# Patient Record
Sex: Female | Born: 1935 | Race: White | Hispanic: No | Smoking: Never smoker
Health system: Southern US, Community
[De-identification: ages and names within clinical notes are randomized; demographics above are authoritative.]

## PROBLEM LIST (undated history)

## (undated) DIAGNOSIS — I10 Essential (primary) hypertension: Secondary | ICD-10-CM

## (undated) DIAGNOSIS — K219 Gastro-esophageal reflux disease without esophagitis: Secondary | ICD-10-CM

## (undated) HISTORY — PX: ABDOMINAL HYSTERECTOMY: SHX81

---

## 2003-12-25 ENCOUNTER — Ambulatory Visit (HOSPITAL_COMMUNITY): Admission: RE | Admit: 2003-12-25 | Discharge: 2003-12-25 | Payer: Self-pay | Admitting: Family Medicine

## 2003-12-30 ENCOUNTER — Ambulatory Visit (HOSPITAL_COMMUNITY): Admission: RE | Admit: 2003-12-30 | Discharge: 2003-12-30 | Payer: Self-pay | Admitting: Family Medicine

## 2003-12-31 ENCOUNTER — Ambulatory Visit (HOSPITAL_COMMUNITY): Admission: RE | Admit: 2003-12-31 | Discharge: 2003-12-31 | Payer: Self-pay | Admitting: Family Medicine

## 2004-01-02 ENCOUNTER — Ambulatory Visit (HOSPITAL_COMMUNITY): Admission: RE | Admit: 2004-01-02 | Discharge: 2004-01-02 | Payer: Self-pay | Admitting: Family Medicine

## 2004-01-11 ENCOUNTER — Ambulatory Visit (HOSPITAL_COMMUNITY): Admission: RE | Admit: 2004-01-11 | Discharge: 2004-01-11 | Payer: Self-pay | Admitting: Neurosurgery

## 2004-01-24 ENCOUNTER — Encounter (INDEPENDENT_AMBULATORY_CARE_PROVIDER_SITE_OTHER): Payer: Self-pay | Admitting: Specialist

## 2004-01-24 ENCOUNTER — Ambulatory Visit (HOSPITAL_COMMUNITY): Admission: RE | Admit: 2004-01-24 | Discharge: 2004-01-24 | Payer: Self-pay | Admitting: Neurosurgery

## 2007-07-07 ENCOUNTER — Ambulatory Visit (HOSPITAL_COMMUNITY): Admission: RE | Admit: 2007-07-07 | Discharge: 2007-07-07 | Payer: Self-pay | Admitting: Family Medicine

## 2010-06-28 ENCOUNTER — Encounter: Payer: Self-pay | Admitting: Family Medicine

## 2012-10-20 ENCOUNTER — Other Ambulatory Visit (HOSPITAL_COMMUNITY): Payer: Self-pay | Admitting: Internal Medicine

## 2012-10-20 DIAGNOSIS — Z0000 Encounter for general adult medical examination without abnormal findings: Secondary | ICD-10-CM

## 2012-10-25 ENCOUNTER — Ambulatory Visit (HOSPITAL_COMMUNITY)
Admission: RE | Admit: 2012-10-25 | Discharge: 2012-10-25 | Disposition: A | Discharge status: Home / Self Care | Payer: Medicare Other | Source: Ambulatory Visit | Attending: Internal Medicine | Admitting: Internal Medicine

## 2012-10-25 DIAGNOSIS — Z0000 Encounter for general adult medical examination without abnormal findings: Secondary | ICD-10-CM

## 2012-10-25 DIAGNOSIS — Z13820 Encounter for screening for osteoporosis: Secondary | ICD-10-CM | POA: Insufficient documentation

## 2012-10-25 DIAGNOSIS — Z780 Asymptomatic menopausal state: Secondary | ICD-10-CM | POA: Insufficient documentation

## 2014-06-19 DIAGNOSIS — I10 Essential (primary) hypertension: Secondary | ICD-10-CM | POA: Diagnosis not present

## 2014-06-19 DIAGNOSIS — Z6824 Body mass index (BMI) 24.0-24.9, adult: Secondary | ICD-10-CM | POA: Diagnosis not present

## 2014-11-25 DIAGNOSIS — Z6823 Body mass index (BMI) 23.0-23.9, adult: Secondary | ICD-10-CM | POA: Diagnosis not present

## 2014-11-25 DIAGNOSIS — I10 Essential (primary) hypertension: Secondary | ICD-10-CM | POA: Diagnosis not present

## 2014-12-23 DIAGNOSIS — Z6824 Body mass index (BMI) 24.0-24.9, adult: Secondary | ICD-10-CM | POA: Diagnosis not present

## 2014-12-23 DIAGNOSIS — Z1389 Encounter for screening for other disorder: Secondary | ICD-10-CM | POA: Diagnosis not present

## 2014-12-23 DIAGNOSIS — Z23 Encounter for immunization: Secondary | ICD-10-CM | POA: Diagnosis not present

## 2014-12-23 DIAGNOSIS — I10 Essential (primary) hypertension: Secondary | ICD-10-CM | POA: Diagnosis not present

## 2014-12-23 DIAGNOSIS — E782 Mixed hyperlipidemia: Secondary | ICD-10-CM | POA: Diagnosis not present

## 2014-12-25 DIAGNOSIS — Z1211 Encounter for screening for malignant neoplasm of colon: Secondary | ICD-10-CM | POA: Diagnosis not present

## 2014-12-30 DIAGNOSIS — L539 Erythematous condition, unspecified: Secondary | ICD-10-CM | POA: Diagnosis not present

## 2014-12-30 DIAGNOSIS — Z1389 Encounter for screening for other disorder: Secondary | ICD-10-CM | POA: Diagnosis not present

## 2014-12-30 DIAGNOSIS — Z6823 Body mass index (BMI) 23.0-23.9, adult: Secondary | ICD-10-CM | POA: Diagnosis not present

## 2014-12-30 DIAGNOSIS — T50Z95A Adverse effect of other vaccines and biological substances, initial encounter: Secondary | ICD-10-CM | POA: Diagnosis not present

## 2015-03-06 DIAGNOSIS — Z1231 Encounter for screening mammogram for malignant neoplasm of breast: Secondary | ICD-10-CM | POA: Diagnosis not present

## 2015-03-28 DIAGNOSIS — Z23 Encounter for immunization: Secondary | ICD-10-CM | POA: Diagnosis not present

## 2015-03-28 DIAGNOSIS — Z6823 Body mass index (BMI) 23.0-23.9, adult: Secondary | ICD-10-CM | POA: Diagnosis not present

## 2015-03-28 DIAGNOSIS — I10 Essential (primary) hypertension: Secondary | ICD-10-CM | POA: Diagnosis not present

## 2015-03-28 DIAGNOSIS — R7309 Other abnormal glucose: Secondary | ICD-10-CM | POA: Diagnosis not present

## 2015-03-28 DIAGNOSIS — E782 Mixed hyperlipidemia: Secondary | ICD-10-CM | POA: Diagnosis not present

## 2015-03-28 DIAGNOSIS — Z1389 Encounter for screening for other disorder: Secondary | ICD-10-CM | POA: Diagnosis not present

## 2015-08-07 DIAGNOSIS — Z6824 Body mass index (BMI) 24.0-24.9, adult: Secondary | ICD-10-CM | POA: Diagnosis not present

## 2015-08-07 DIAGNOSIS — I4510 Unspecified right bundle-branch block: Secondary | ICD-10-CM | POA: Diagnosis not present

## 2015-08-07 DIAGNOSIS — Z1389 Encounter for screening for other disorder: Secondary | ICD-10-CM | POA: Diagnosis not present

## 2015-08-07 DIAGNOSIS — I10 Essential (primary) hypertension: Secondary | ICD-10-CM | POA: Diagnosis not present

## 2015-08-13 DIAGNOSIS — Z6823 Body mass index (BMI) 23.0-23.9, adult: Secondary | ICD-10-CM | POA: Diagnosis not present

## 2015-08-13 DIAGNOSIS — Z0000 Encounter for general adult medical examination without abnormal findings: Secondary | ICD-10-CM | POA: Diagnosis not present

## 2015-08-15 DIAGNOSIS — H5210 Myopia, unspecified eye: Secondary | ICD-10-CM | POA: Diagnosis not present

## 2015-08-15 DIAGNOSIS — H25813 Combined forms of age-related cataract, bilateral: Secondary | ICD-10-CM | POA: Diagnosis not present

## 2015-08-15 DIAGNOSIS — H5200 Hypermetropia, unspecified eye: Secondary | ICD-10-CM | POA: Diagnosis not present

## 2015-11-28 DIAGNOSIS — I10 Essential (primary) hypertension: Secondary | ICD-10-CM | POA: Diagnosis not present

## 2015-11-28 DIAGNOSIS — Z6823 Body mass index (BMI) 23.0-23.9, adult: Secondary | ICD-10-CM | POA: Diagnosis not present

## 2015-12-03 DIAGNOSIS — Z1389 Encounter for screening for other disorder: Secondary | ICD-10-CM | POA: Diagnosis not present

## 2015-12-03 DIAGNOSIS — I10 Essential (primary) hypertension: Secondary | ICD-10-CM | POA: Diagnosis not present

## 2015-12-03 DIAGNOSIS — Z6823 Body mass index (BMI) 23.0-23.9, adult: Secondary | ICD-10-CM | POA: Diagnosis not present

## 2016-03-26 DIAGNOSIS — Z1231 Encounter for screening mammogram for malignant neoplasm of breast: Secondary | ICD-10-CM | POA: Diagnosis not present

## 2016-09-02 DIAGNOSIS — Z6823 Body mass index (BMI) 23.0-23.9, adult: Secondary | ICD-10-CM | POA: Diagnosis not present

## 2016-09-02 DIAGNOSIS — I10 Essential (primary) hypertension: Secondary | ICD-10-CM | POA: Diagnosis not present

## 2016-09-02 DIAGNOSIS — Z1389 Encounter for screening for other disorder: Secondary | ICD-10-CM | POA: Diagnosis not present

## 2016-09-02 DIAGNOSIS — E782 Mixed hyperlipidemia: Secondary | ICD-10-CM | POA: Diagnosis not present

## 2016-12-01 DIAGNOSIS — Z1389 Encounter for screening for other disorder: Secondary | ICD-10-CM | POA: Diagnosis not present

## 2016-12-01 DIAGNOSIS — E782 Mixed hyperlipidemia: Secondary | ICD-10-CM | POA: Diagnosis not present

## 2016-12-01 DIAGNOSIS — I10 Essential (primary) hypertension: Secondary | ICD-10-CM | POA: Diagnosis not present

## 2016-12-01 DIAGNOSIS — Z6822 Body mass index (BMI) 22.0-22.9, adult: Secondary | ICD-10-CM | POA: Diagnosis not present

## 2016-12-28 DIAGNOSIS — R062 Wheezing: Secondary | ICD-10-CM | POA: Diagnosis not present

## 2016-12-28 DIAGNOSIS — Z6823 Body mass index (BMI) 23.0-23.9, adult: Secondary | ICD-10-CM | POA: Diagnosis not present

## 2016-12-28 DIAGNOSIS — I10 Essential (primary) hypertension: Secondary | ICD-10-CM | POA: Diagnosis not present

## 2016-12-28 DIAGNOSIS — E782 Mixed hyperlipidemia: Secondary | ICD-10-CM | POA: Diagnosis not present

## 2016-12-28 DIAGNOSIS — J069 Acute upper respiratory infection, unspecified: Secondary | ICD-10-CM | POA: Diagnosis not present

## 2016-12-28 DIAGNOSIS — Z1389 Encounter for screening for other disorder: Secondary | ICD-10-CM | POA: Diagnosis not present

## 2016-12-28 DIAGNOSIS — J343 Hypertrophy of nasal turbinates: Secondary | ICD-10-CM | POA: Diagnosis not present

## 2017-03-28 DIAGNOSIS — Z1231 Encounter for screening mammogram for malignant neoplasm of breast: Secondary | ICD-10-CM | POA: Diagnosis not present

## 2017-04-07 DIAGNOSIS — N6001 Solitary cyst of right breast: Secondary | ICD-10-CM | POA: Diagnosis not present

## 2017-04-15 DIAGNOSIS — Z1389 Encounter for screening for other disorder: Secondary | ICD-10-CM | POA: Diagnosis not present

## 2017-04-15 DIAGNOSIS — Z6823 Body mass index (BMI) 23.0-23.9, adult: Secondary | ICD-10-CM | POA: Diagnosis not present

## 2017-04-15 DIAGNOSIS — Z0000 Encounter for general adult medical examination without abnormal findings: Secondary | ICD-10-CM | POA: Diagnosis not present

## 2017-04-18 DIAGNOSIS — Z1389 Encounter for screening for other disorder: Secondary | ICD-10-CM | POA: Diagnosis not present

## 2017-04-18 DIAGNOSIS — Z0000 Encounter for general adult medical examination without abnormal findings: Secondary | ICD-10-CM | POA: Diagnosis not present

## 2017-04-18 DIAGNOSIS — R739 Hyperglycemia, unspecified: Secondary | ICD-10-CM | POA: Diagnosis not present

## 2017-04-18 DIAGNOSIS — Z6823 Body mass index (BMI) 23.0-23.9, adult: Secondary | ICD-10-CM | POA: Diagnosis not present

## 2017-04-18 DIAGNOSIS — R7309 Other abnormal glucose: Secondary | ICD-10-CM | POA: Diagnosis not present

## 2017-05-11 DIAGNOSIS — Z6822 Body mass index (BMI) 22.0-22.9, adult: Secondary | ICD-10-CM | POA: Diagnosis not present

## 2017-05-11 DIAGNOSIS — I10 Essential (primary) hypertension: Secondary | ICD-10-CM | POA: Diagnosis not present

## 2017-05-11 DIAGNOSIS — J329 Chronic sinusitis, unspecified: Secondary | ICD-10-CM | POA: Diagnosis not present

## 2017-05-11 DIAGNOSIS — R498 Other voice and resonance disorders: Secondary | ICD-10-CM | POA: Diagnosis not present

## 2017-07-28 DIAGNOSIS — Z1389 Encounter for screening for other disorder: Secondary | ICD-10-CM | POA: Diagnosis not present

## 2017-07-28 DIAGNOSIS — J3489 Other specified disorders of nose and nasal sinuses: Secondary | ICD-10-CM | POA: Diagnosis not present

## 2017-07-28 DIAGNOSIS — J019 Acute sinusitis, unspecified: Secondary | ICD-10-CM | POA: Diagnosis not present

## 2017-07-28 DIAGNOSIS — Z6822 Body mass index (BMI) 22.0-22.9, adult: Secondary | ICD-10-CM | POA: Diagnosis not present

## 2017-11-16 DIAGNOSIS — Z6822 Body mass index (BMI) 22.0-22.9, adult: Secondary | ICD-10-CM | POA: Diagnosis not present

## 2017-11-16 DIAGNOSIS — I10 Essential (primary) hypertension: Secondary | ICD-10-CM | POA: Diagnosis not present

## 2017-11-16 DIAGNOSIS — Z1389 Encounter for screening for other disorder: Secondary | ICD-10-CM | POA: Diagnosis not present

## 2018-04-10 DIAGNOSIS — Z1231 Encounter for screening mammogram for malignant neoplasm of breast: Secondary | ICD-10-CM | POA: Diagnosis not present

## 2018-05-22 DIAGNOSIS — Z0000 Encounter for general adult medical examination without abnormal findings: Secondary | ICD-10-CM | POA: Diagnosis not present

## 2018-05-22 DIAGNOSIS — R7309 Other abnormal glucose: Secondary | ICD-10-CM | POA: Diagnosis not present

## 2018-05-22 DIAGNOSIS — Z6823 Body mass index (BMI) 23.0-23.9, adult: Secondary | ICD-10-CM | POA: Diagnosis not present

## 2018-05-22 DIAGNOSIS — Z1389 Encounter for screening for other disorder: Secondary | ICD-10-CM | POA: Diagnosis not present

## 2018-05-22 DIAGNOSIS — Z0001 Encounter for general adult medical examination with abnormal findings: Secondary | ICD-10-CM | POA: Diagnosis not present

## 2018-06-06 DIAGNOSIS — Z6822 Body mass index (BMI) 22.0-22.9, adult: Secondary | ICD-10-CM | POA: Diagnosis not present

## 2018-06-06 DIAGNOSIS — Z1389 Encounter for screening for other disorder: Secondary | ICD-10-CM | POA: Diagnosis not present

## 2018-06-06 DIAGNOSIS — R05 Cough: Secondary | ICD-10-CM | POA: Diagnosis not present

## 2018-06-06 DIAGNOSIS — J343 Hypertrophy of nasal turbinates: Secondary | ICD-10-CM | POA: Diagnosis not present

## 2018-06-06 DIAGNOSIS — J029 Acute pharyngitis, unspecified: Secondary | ICD-10-CM | POA: Diagnosis not present

## 2018-10-23 ENCOUNTER — Other Ambulatory Visit (HOSPITAL_COMMUNITY): Payer: Self-pay | Admitting: Family Medicine

## 2018-10-23 DIAGNOSIS — E2839 Other primary ovarian failure: Secondary | ICD-10-CM

## 2019-01-10 DIAGNOSIS — H5200 Hypermetropia, unspecified eye: Secondary | ICD-10-CM | POA: Diagnosis not present

## 2019-01-18 DIAGNOSIS — Z0100 Encounter for examination of eyes and vision without abnormal findings: Secondary | ICD-10-CM | POA: Diagnosis not present

## 2019-03-13 DIAGNOSIS — Z23 Encounter for immunization: Secondary | ICD-10-CM | POA: Diagnosis not present

## 2019-03-13 DIAGNOSIS — Z6823 Body mass index (BMI) 23.0-23.9, adult: Secondary | ICD-10-CM | POA: Diagnosis not present

## 2019-03-13 DIAGNOSIS — Z1389 Encounter for screening for other disorder: Secondary | ICD-10-CM | POA: Diagnosis not present

## 2019-03-13 DIAGNOSIS — E7849 Other hyperlipidemia: Secondary | ICD-10-CM | POA: Diagnosis not present

## 2019-03-13 DIAGNOSIS — Z0000 Encounter for general adult medical examination without abnormal findings: Secondary | ICD-10-CM | POA: Diagnosis not present

## 2019-03-13 DIAGNOSIS — I10 Essential (primary) hypertension: Secondary | ICD-10-CM | POA: Diagnosis not present

## 2019-05-07 DIAGNOSIS — Z1231 Encounter for screening mammogram for malignant neoplasm of breast: Secondary | ICD-10-CM | POA: Diagnosis not present

## 2019-09-18 ENCOUNTER — Encounter (HOSPITAL_COMMUNITY): Payer: Self-pay

## 2019-09-18 ENCOUNTER — Other Ambulatory Visit: Payer: Self-pay

## 2019-09-18 DIAGNOSIS — R739 Hyperglycemia, unspecified: Secondary | ICD-10-CM | POA: Insufficient documentation

## 2019-09-18 DIAGNOSIS — I10 Essential (primary) hypertension: Secondary | ICD-10-CM | POA: Diagnosis not present

## 2019-09-18 DIAGNOSIS — R1013 Epigastric pain: Secondary | ICD-10-CM | POA: Insufficient documentation

## 2019-09-18 DIAGNOSIS — E1165 Type 2 diabetes mellitus with hyperglycemia: Secondary | ICD-10-CM | POA: Diagnosis not present

## 2019-09-18 DIAGNOSIS — R109 Unspecified abdominal pain: Secondary | ICD-10-CM | POA: Diagnosis present

## 2019-09-18 MED ORDER — SODIUM CHLORIDE 0.9% FLUSH: 3.0000 mL | Freq: Once | INTRAVENOUS | Status: DC

## 2019-09-18 NOTE — ED Triage Notes (Signed)
Pt presents to ED with complaints of epigastric abdominal pain and nausea started couple hours ago.

## 2019-09-19 ENCOUNTER — Emergency Department (HOSPITAL_COMMUNITY)
Admission: EM | Admit: 2019-09-19 | Discharge: 2019-09-19 | Disposition: A | Discharge status: Home / Self Care | Payer: Medicare HMO | Attending: Emergency Medicine | Admitting: Emergency Medicine

## 2019-09-19 DIAGNOSIS — E1165 Type 2 diabetes mellitus with hyperglycemia: Secondary | ICD-10-CM | POA: Diagnosis not present

## 2019-09-19 DIAGNOSIS — R1013 Epigastric pain: Secondary | ICD-10-CM

## 2019-09-19 DIAGNOSIS — R739 Hyperglycemia, unspecified: Secondary | ICD-10-CM

## 2019-09-19 DIAGNOSIS — R7309 Other abnormal glucose: Secondary | ICD-10-CM

## 2019-09-19 HISTORY — DX: Essential (primary) hypertension: I10

## 2019-09-19 LAB — CBC
HCT: 41 % (ref 36.0–46.0)
Hemoglobin: 13.9 g/dL (ref 12.0–15.0)
MCH: 31.7 pg (ref 26.0–34.0)
MCHC: 33.9 g/dL (ref 30.0–36.0)
MCV: 93.4 fL (ref 80.0–100.0)
Platelets: 284 10*3/uL (ref 150–400)
RBC: 4.39 MIL/uL (ref 3.87–5.11)
RDW: 12.6 % (ref 11.5–15.5)
WBC: 11.5 10*3/uL — ABNORMAL HIGH (ref 4.0–10.5)
nRBC: 0 % (ref 0.0–0.2)

## 2019-09-19 LAB — COMPREHENSIVE METABOLIC PANEL
ALT: 33 U/L (ref 0–44)
AST: 26 U/L (ref 15–41)
Albumin: 4.7 g/dL (ref 3.5–5.0)
Alkaline Phosphatase: 88 U/L (ref 38–126)
Anion gap: 9 (ref 5–15)
BUN: 19 mg/dL (ref 8–23)
CO2: 25 mmol/L (ref 22–32)
Calcium: 9.6 mg/dL (ref 8.9–10.3)
Chloride: 103 mmol/L (ref 98–111)
Creatinine, Ser: 0.52 mg/dL (ref 0.44–1.00)
GFR calc Af Amer: >60 mL/min (ref >60)
GFR calc non Af Amer: >60 mL/min (ref >60)
Glucose, Bld: 158 mg/dL — ABNORMAL HIGH (ref 70–99)
Potassium: 3.6 mmol/L (ref 3.5–5.1)
Sodium: 137 mmol/L (ref 135–145)
Total Bilirubin: 0.4 mg/dL (ref 0.3–1.2)
Total Protein: 7.9 g/dL (ref 6.5–8.1)

## 2019-09-19 LAB — LIPASE, BLOOD: Lipase: 25 U/L (ref 11–51)

## 2019-09-19 LAB — TROPONIN I (HIGH SENSITIVITY)
Troponin I (High Sensitivity): 3 ng/L (ref <18)
Troponin I (High Sensitivity): 3 ng/L (ref <18)

## 2019-09-19 MED ORDER — ALUM & MAG HYDROXIDE-SIMETH 200-200-20 MG/5ML PO SUSP
30.0000 mL | Freq: Once | ORAL | Status: AC
  Administered 2019-09-19: 30 mL via ORAL
  Filled 2019-09-19: qty 30

## 2019-09-19 MED ORDER — LIDOCAINE VISCOUS HCL 2 % MT SOLN
15.0000 mL | Freq: Once | OROMUCOSAL | Status: AC
  Administered 2019-09-19: 15 mL via ORAL
  Filled 2019-09-19: qty 15

## 2019-09-19 MED ORDER — PANTOPRAZOLE SODIUM 40 MG PO TBEC: 40.0000 mg | DELAYED_RELEASE_TABLET | Freq: Every day | ORAL | 0 refills | Status: DC

## 2019-09-19 MED ORDER — ONDANSETRON 8 MG PO TBDP
8.0000 mg | ORAL_TABLET | Freq: Once | ORAL | Status: AC
  Administered 2019-09-19: 8 mg via ORAL
  Filled 2019-09-19: qty 1

## 2019-09-19 MED ORDER — PANTOPRAZOLE SODIUM 40 MG PO TBEC
40.0000 mg | DELAYED_RELEASE_TABLET | Freq: Once | ORAL | Status: AC
  Administered 2019-09-19: 40 mg via ORAL
  Filled 2019-09-19: qty 1

## 2019-09-19 NOTE — ED Provider Notes (Signed)
[facility] EMERGENCY DEPARTMENT Provider Note   CSN: [id number] Arrival date & time: 09/18/19  2232   History Chief Complaint  Patient presents with  . Abdominal Pain    Felicia Whitehead is a 84 y.o. female.  The history is provided by the patient.  Abdominal Pain She has history of hypertension and comes in because of epigastric pain without radiation.  Pain started about 8 PM.  This started shortly after eating a hot dog.  There is associated nausea but no vomiting.  Pain seems to be subsiding, but she still rates it at 8/10.  She denies dyspnea or diaphoresis.  She did not have a bowel movement today, but that is not necessarily unusual for her.  She denies history of diabetes, hyperlipidemia.  She denies tobacco use and denies family history of premature coronary atherosclerosis.  Past Medical History:  Diagnosis Date  . Hypertension     There are no problems to display for this patient.   History reviewed. No pertinent surgical history.   OB History   No obstetric history on file.     No family history on file.  Social History   Tobacco Use  . Smoking status: Never Smoker  . Smokeless tobacco: Never Used  Substance Use Topics  . Alcohol use: Never  . Drug use: Never    Home Medications Prior to Admission medications   Not on File    Allergies    Patient has no allergy information on record.  Review of Systems   Review of Systems  Gastrointestinal: Positive for abdominal pain.  All other systems reviewed and are negative.   Physical Exam Updated Vital Signs BP (!) 162/81 (BP Location: Right Arm)   Pulse (!) 102   Temp 98.2 F (36.8 C) (Oral)   Resp 18   Ht 5\' 3"  (1.6 m)   Wt 62.1 kg   SpO2 99%   BMI 24.27 kg/m   Physical Exam Vitals and nursing note reviewed.   84 year old female, resting comfortably and in no acute distress. Vital signs are significant for borderline elevated heart rate and elevated blood pressure. Oxygen saturation  is 99%, which is normal. Head is normocephalic and atraumatic. PERRLA, EOMI. Oropharynx is clear. Neck is nontender and supple without adenopathy or JVD. Back is nontender and there is no CVA tenderness. Lungs are clear without rales, wheezes, or rhonchi. Chest is nontender. Heart has regular rate and rhythm without murmur. Abdomen is soft, flat, with minimal epigastric tenderness.  There is no rebound or guarding.  There are no masses or hepatosplenomegaly and peristalsis is hypoactive. Extremities have no cyanosis or edema, full range of motion is present. Skin is warm and dry without rash. Neurologic: Mental status is normal, cranial nerves are intact, there are no motor or sensory deficits.  ED Results / Procedures / Treatments   Labs (all labs ordered are listed, but only abnormal results are displayed) Labs Reviewed  COMPREHENSIVE METABOLIC PANEL - Abnormal; Notable for the following components:      Result Value   Glucose, Bld 158 (*)    All other components within normal limits  CBC - Abnormal; Notable for the following components:   WBC 11.5 (*)    All other components within normal limits  LIPASE, BLOOD  URINALYSIS, ROUTINE W REFLEX MICROSCOPIC  TROPONIN I (HIGH SENSITIVITY)  TROPONIN I (HIGH SENSITIVITY)    EKG EKG Interpretation  Date/Time:  Tuesday September 18 2019 22:44:21 EDT Ventricular Rate:  69  PR Interval:  152 QRS Duration: 140 QT Interval:  394 QTC Calculation: 479 R Axis:   12 Text Interpretation: Normal sinus rhythm Possible Left atrial enlargement Right bundle branch block Abnormal ECG No old tracing to compare Confirmed by Dione Booze ([id number]) on 09/19/2019 12:28:23 AM   Procedures Procedures  Medications Ordered in ED Medications  sodium chloride flush (NS) 0.9 % injection 3 mL (has no administration in time range)    ED Course  I have reviewed the triage vital signs and the nursing notes.  Pertinent labs & imaging results that were available  during my care of the patient were reviewed by me and considered in my medical decision making (see chart for details).  Epigastric pain of uncertain cause.  Certainly, need to consider angina equivalent.  This is a condition with high potential morbidity and mortality.  Possible gastritis.  Consider pancreatitis, although presentation is very atypical for that.  Consider biliary colic, although no right upper quadrant tenderness present.  Old records are reviewed, and she has no relevant past visits, no prior abdominal imaging.  I do not see indications for abdominal imaging currently.  ECG shows right bundle branch block with no ST or T changes, no prior ECG available for comparison.  Will check screening labs including troponin and give a GI cocktail.  Initial work-up is significant only for glucose elevated to 158.  This will need to be followed as an outpatient.  Second troponin is pending.  Her heart pathway score is 4 which does put her at elevated risk for major adverse cardiac events.  If second troponin is normal, she can safely be discharged to continue work-up as an outpatient.  Repeat troponin is normal.  She is given a prescription for pantoprazole and is to follow-up with her primary care provider.  MDM Rules/Calculators/A&P  Final Clinical Impression(s) / ED Diagnoses Final diagnoses:  Epigastric pain  Elevated random blood glucose level    Rx / DC Orders ED Discharge Orders         Ordered    pantoprazole (PROTONIX) 40 MG tablet  Daily     09/19/19 0237           Dione Booze, MD 09/19/19 [phone]

## 2019-09-19 NOTE — Discharge Instructions (Addendum)
Use antacids as needed.  Try to avoid foods that seem to bother your stomach.  Return if you are having any problems.  Your blood sugar was a little high today. Please follow up with your primary care provider to monitor that.

## 2019-09-21 DIAGNOSIS — Z6823 Body mass index (BMI) 23.0-23.9, adult: Secondary | ICD-10-CM | POA: Diagnosis not present

## 2019-09-21 DIAGNOSIS — T50905A Adverse effect of unspecified drugs, medicaments and biological substances, initial encounter: Secondary | ICD-10-CM | POA: Diagnosis not present

## 2019-09-21 DIAGNOSIS — F4320 Adjustment disorder, unspecified: Secondary | ICD-10-CM | POA: Diagnosis not present

## 2019-09-21 DIAGNOSIS — K219 Gastro-esophageal reflux disease without esophagitis: Secondary | ICD-10-CM | POA: Diagnosis not present

## 2019-09-21 DIAGNOSIS — Z1389 Encounter for screening for other disorder: Secondary | ICD-10-CM | POA: Diagnosis not present

## 2019-09-24 DIAGNOSIS — Z1389 Encounter for screening for other disorder: Secondary | ICD-10-CM | POA: Diagnosis not present

## 2019-09-24 DIAGNOSIS — E782 Mixed hyperlipidemia: Secondary | ICD-10-CM | POA: Diagnosis not present

## 2019-09-24 DIAGNOSIS — I10 Essential (primary) hypertension: Secondary | ICD-10-CM | POA: Diagnosis not present

## 2019-09-24 DIAGNOSIS — Z6823 Body mass index (BMI) 23.0-23.9, adult: Secondary | ICD-10-CM | POA: Diagnosis not present

## 2019-09-24 DIAGNOSIS — R7309 Other abnormal glucose: Secondary | ICD-10-CM | POA: Diagnosis not present

## 2019-10-03 ENCOUNTER — Emergency Department (HOSPITAL_COMMUNITY): Payer: Medicare HMO

## 2019-10-03 ENCOUNTER — Observation Stay (HOSPITAL_COMMUNITY)
Admission: EM | Admit: 2019-10-03 | Discharge: 2019-10-04 | Disposition: A | Discharge status: Home / Self Care | Payer: Medicare HMO | Attending: Internal Medicine | Admitting: Internal Medicine

## 2019-10-03 ENCOUNTER — Encounter (HOSPITAL_COMMUNITY): Payer: Self-pay

## 2019-10-03 ENCOUNTER — Other Ambulatory Visit: Payer: Self-pay

## 2019-10-03 DIAGNOSIS — K760 Fatty (change of) liver, not elsewhere classified: Secondary | ICD-10-CM | POA: Diagnosis not present

## 2019-10-03 DIAGNOSIS — A048 Other specified bacterial intestinal infections: Secondary | ICD-10-CM

## 2019-10-03 DIAGNOSIS — K219 Gastro-esophageal reflux disease without esophagitis: Secondary | ICD-10-CM | POA: Diagnosis not present

## 2019-10-03 DIAGNOSIS — Z79899 Other long term (current) drug therapy: Secondary | ICD-10-CM | POA: Insufficient documentation

## 2019-10-03 DIAGNOSIS — R1013 Epigastric pain: Secondary | ICD-10-CM

## 2019-10-03 DIAGNOSIS — K8010 Calculus of gallbladder with chronic cholecystitis without obstruction: Secondary | ICD-10-CM

## 2019-10-03 DIAGNOSIS — K8020 Calculus of gallbladder without cholecystitis without obstruction: Secondary | ICD-10-CM | POA: Diagnosis not present

## 2019-10-03 DIAGNOSIS — B9681 Helicobacter pylori [H. pylori] as the cause of diseases classified elsewhere: Secondary | ICD-10-CM | POA: Diagnosis not present

## 2019-10-03 DIAGNOSIS — Z20822 Contact with and (suspected) exposure to covid-19: Secondary | ICD-10-CM | POA: Diagnosis not present

## 2019-10-03 DIAGNOSIS — R55 Syncope and collapse: Secondary | ICD-10-CM | POA: Diagnosis not present

## 2019-10-03 DIAGNOSIS — Z888 Allergy status to other drugs, medicaments and biological substances status: Secondary | ICD-10-CM | POA: Diagnosis not present

## 2019-10-03 DIAGNOSIS — K810 Acute cholecystitis: Principal | ICD-10-CM | POA: Diagnosis present

## 2019-10-03 DIAGNOSIS — I10 Essential (primary) hypertension: Secondary | ICD-10-CM | POA: Insufficient documentation

## 2019-10-03 DIAGNOSIS — R531 Weakness: Secondary | ICD-10-CM | POA: Insufficient documentation

## 2019-10-03 LAB — HEPATIC FUNCTION PANEL
ALT: 25 U/L (ref 0–44)
AST: 20 U/L (ref 15–41)
Albumin: 4.2 g/dL (ref 3.5–5.0)
Alkaline Phosphatase: 76 U/L (ref 38–126)
Bilirubin, Direct: <0.1 mg/dL (ref 0.0–0.2)
Total Bilirubin: 0.3 mg/dL (ref 0.3–1.2)
Total Protein: 7.7 g/dL (ref 6.5–8.1)

## 2019-10-03 LAB — CBC WITH DIFFERENTIAL/PLATELET
Abs Immature Granulocytes: 0.04 10*3/uL (ref 0.00–0.07)
Basophils Absolute: 0.1 10*3/uL (ref 0.0–0.1)
Basophils Relative: 1 %
Eosinophils Absolute: 0.1 10*3/uL (ref 0.0–0.5)
Eosinophils Relative: 1 %
HCT: 42.3 % (ref 36.0–46.0)
Hemoglobin: 14.6 g/dL (ref 12.0–15.0)
Immature Granulocytes: 1 %
Lymphocytes Relative: 13 %
Lymphs Abs: 1 10*3/uL (ref 0.7–4.0)
MCH: 31.3 pg (ref 26.0–34.0)
MCHC: 34.5 g/dL (ref 30.0–36.0)
MCV: 90.6 fL (ref 80.0–100.0)
Monocytes Absolute: 0.6 10*3/uL (ref 0.1–1.0)
Monocytes Relative: 8 %
Neutro Abs: 5.9 10*3/uL (ref 1.7–7.7)
Neutrophils Relative %: 76 %
Platelets: 319 10*3/uL (ref 150–400)
RBC: 4.67 MIL/uL (ref 3.87–5.11)
RDW: 12 % (ref 11.5–15.5)
WBC: 7.7 10*3/uL (ref 4.0–10.5)
nRBC: 0 % (ref 0.0–0.2)

## 2019-10-03 LAB — RESPIRATORY PANEL BY RT PCR (FLU A&B, COVID)
Influenza A by PCR: NEGATIVE
Influenza B by PCR: NEGATIVE
SARS Coronavirus 2 by RT PCR: NEGATIVE

## 2019-10-03 LAB — BASIC METABOLIC PANEL
Anion gap: 12 (ref 5–15)
BUN: 17 mg/dL (ref 8–23)
CO2: 26 mmol/L (ref 22–32)
Calcium: 9.8 mg/dL (ref 8.9–10.3)
Chloride: 101 mmol/L (ref 98–111)
Creatinine, Ser: 0.62 mg/dL (ref 0.44–1.00)
GFR calc Af Amer: >60 mL/min (ref >60)
GFR calc non Af Amer: >60 mL/min (ref >60)
Glucose, Bld: 120 mg/dL — ABNORMAL HIGH (ref 70–99)
Potassium: 3 mmol/L — ABNORMAL LOW (ref 3.5–5.1)
Sodium: 139 mmol/L (ref 135–145)

## 2019-10-03 LAB — URINALYSIS, ROUTINE W REFLEX MICROSCOPIC
Bacteria, UA: NONE SEEN
Bilirubin Urine: NEGATIVE
Glucose, UA: NEGATIVE mg/dL
Ketones, ur: 5 mg/dL — AB
Leukocytes,Ua: NEGATIVE
Nitrite: NEGATIVE
Protein, ur: NEGATIVE mg/dL
Specific Gravity, Urine: 1.005 (ref 1.005–1.030)
pH: 8 (ref 5.0–8.0)

## 2019-10-03 LAB — TROPONIN I (HIGH SENSITIVITY)
Troponin I (High Sensitivity): 3 ng/L (ref <18)
Troponin I (High Sensitivity): 5 ng/L (ref <18)

## 2019-10-03 LAB — LIPASE, BLOOD: Lipase: 29 U/L (ref 11–51)

## 2019-10-03 LAB — CBG MONITORING, ED: Glucose-Capillary: 114 mg/dL — ABNORMAL HIGH (ref 70–99)

## 2019-10-03 MED ORDER — ACETAMINOPHEN 325 MG PO TABS: 650.0000 mg | ORAL_TABLET | Freq: Four times a day (QID) | ORAL | Status: DC | PRN

## 2019-10-03 MED ORDER — SODIUM CHLORIDE 0.9 % IV BOLUS
1000.0000 mL | Freq: Once | INTRAVENOUS | Status: AC
  Administered 2019-10-03: 1000 mL via INTRAVENOUS

## 2019-10-03 MED ORDER — ACETAMINOPHEN 650 MG RE SUPP: 650.0000 mg | Freq: Four times a day (QID) | RECTAL | Status: DC | PRN

## 2019-10-03 MED ORDER — ONDANSETRON HCL 4 MG PO TABS: 4.0000 mg | ORAL_TABLET | Freq: Four times a day (QID) | ORAL | Status: DC | PRN

## 2019-10-03 MED ORDER — PANTOPRAZOLE SODIUM 40 MG PO TBEC
40.0000 mg | DELAYED_RELEASE_TABLET | Freq: Every day | ORAL | Status: DC
  Administered 2019-10-03 – 2019-10-04 (×2): 40 mg via ORAL
  Filled 2019-10-03 (×2): qty 1

## 2019-10-03 MED ORDER — FLUOXETINE HCL 20 MG PO CAPS
20.0000 mg | ORAL_CAPSULE | Freq: Every day | ORAL | Status: DC
  Administered 2019-10-03 – 2019-10-04 (×2): 20 mg via ORAL
  Filled 2019-10-03 (×2): qty 1

## 2019-10-03 MED ORDER — POTASSIUM CHLORIDE 2 MEQ/ML IV SOLN: INTRAVENOUS | Status: DC

## 2019-10-03 MED ORDER — ONDANSETRON HCL 4 MG/2ML IJ SOLN: 4.0000 mg | Freq: Four times a day (QID) | INTRAMUSCULAR | Status: DC | PRN

## 2019-10-03 MED ORDER — POTASSIUM CHLORIDE IN NACL 20-0.9 MEQ/L-% IV SOLN: INTRAVENOUS | Status: DC

## 2019-10-03 MED ORDER — ENOXAPARIN SODIUM 40 MG/0.4ML ~~LOC~~ SOLN
40.0000 mg | SUBCUTANEOUS | Status: DC
  Administered 2019-10-03: 40 mg via SUBCUTANEOUS
  Filled 2019-10-03: qty 0.4

## 2019-10-03 MED ORDER — SODIUM CHLORIDE 0.9% FLUSH
3.0000 mL | Freq: Once | INTRAVENOUS | Status: AC
  Administered 2019-10-03: 3 mL via INTRAVENOUS

## 2019-10-03 MED ORDER — GADOBUTROL 1 MMOL/ML IV SOLN
7.0000 mL | Freq: Once | INTRAVENOUS | Status: AC | PRN
  Administered 2019-10-03: 7 mL via INTRAVENOUS

## 2019-10-03 MED ORDER — POTASSIUM CHLORIDE CRYS ER 20 MEQ PO TBCR
20.0000 meq | EXTENDED_RELEASE_TABLET | Freq: Once | ORAL | Status: AC
  Administered 2019-10-03: 20 meq via ORAL
  Filled 2019-10-03: qty 1

## 2019-10-03 NOTE — H&P (Signed)
History and Physical    [place] AJO:[id number] DOB: 1935/09/25 DOA: 10/03/2019  PCP: Avis Epley, PA-C  Patient coming from: Home  I have personally briefly reviewed patient's old medical records in [facility]  Chief Complaint: Lightheadedness  HPI: [place] is a 84 y.o. female with medical history significant of hypertension, GERD, who was seen in the emergency room few weeks ago with complaints of epigastric discomfort.  She was felt to have GERD and was discharged on proton pump inhibitor.  She followed up with her primary care physician who performed urease breath test she was diagnosed with H. pylori.  She was started on antibiotics in addition to PPI.  Since taking antibiotics, she has felt that her appetite has been poor and her taste has been altered.  She has continued to have intermittent episodes of epigastric discomfort.  She denies any fever.  She has not had any diarrhea.  Today, she felt increasingly weak.  When she stood up she felt lightheaded, nauseous and flushed.  She did not pass out although she felt that she was going to pass out.  She came to the ER for evaluation.  When she was stood up in the emergency room, she had similar symptoms.  She received IV fluids and reported improvement of her symptoms.  She had an ultrasound of her abdomen done that indicated gallstones.  This was further followed up with MRI that did not show any biliary stones, but did show gallstones with gallbladder wall thickening.  She has been referred for admission.   Review of Systems: As per HPI otherwise 10 point review of systems negative.    Past Medical History:  Diagnosis Date  . Hypertension     History reviewed. No pertinent surgical history.  Social History:  reports that she has never smoked. She has never used smokeless tobacco. She reports that she does not drink alcohol or use drugs.  Allergies  Allergen Reactions  . Claritin-D 24 Hour  [Loratadine-Pseudoephedrine Er]     Family history: Family history reviewed and not pertinent  Prior to Admission medications   Medication Sig Start Date End Date Taking? Authorizing Provider  amLODipine (NORVASC) 10 MG tablet  08/24/19  Yes [provider]  amoxicillin (AMOXIL) 500 MG capsule Take 1,000 mg by mouth 2 (two) times daily. 09/26/19  Yes [provider]  benazepril (LOTENSIN) 5 MG tablet Take 5 mg by mouth daily. 08/24/19  Yes [provider]  clarithromycin (BIAXIN) 500 MG tablet Take 500 mg by mouth 2 (two) times daily. 09/26/19  Yes [provider]  FLUoxetine (PROZAC) 20 MG capsule Take 20 mg by mouth daily. 09/21/19  Yes [provider]  lansoprazole (PREVACID) 30 MG capsule Take 30 mg by mouth 2 (two) times daily. 09/26/19  Yes [provider]  pantoprazole (PROTONIX) 40 MG tablet Take 1 tablet (40 mg total) by mouth daily. Patient not taking: Reported on 10/03/2019 09/19/19   Dione Booze, MD    Physical Exam: Vitals:   10/03/19 1700 10/03/19 1730 10/03/19 1752 10/03/19 1930  BP: 127/65 (!) 118/54 91/72   Pulse: 71 68 68   Resp: 15 15 18    Temp:   98.6 F (37 C)   TempSrc:      SpO2: 97% 97%  97%  Weight:      Height:        Constitutional: NAD, calm, comfortable Eyes: PERRL, lids and conjunctivae normal ENMT: Mucous membranes are moist. Posterior  pharynx clear of any exudate or lesions.Normal dentition.  Neck: normal, supple, no masses, no thyromegaly Respiratory: clear to auscultation bilaterally, no wheezing, no crackles. Normal respiratory effort. No accessory muscle use.  Cardiovascular: Regular rate and rhythm, no murmurs / rubs / gallops. No extremity edema. 2+ pedal pulses. No carotid bruits.  Abdomen: no tenderness, no masses palpated. No hepatosplenomegaly. Bowel sounds positive.  Musculoskeletal: no clubbing / cyanosis. No joint deformity upper and lower extremities. Good ROM, no contractures. Normal  muscle tone.  Skin: no rashes, lesions, ulcers. No induration Neurologic: CN 2-12 grossly intact. Sensation intact, DTR normal. Strength 5/5 in all 4.  Psychiatric: Normal judgment and insight. Alert and oriented x 3. Normal mood.    Labs on Admission: I have personally reviewed following labs and imaging studies  CBC: Recent Labs  Lab 10/03/19 0943  WBC 7.7  NEUTROABS 5.9  HGB 14.6  HCT 42.3  MCV 90.6  PLT 034   Basic Metabolic Panel: Recent Labs  Lab 10/03/19 0943  NA 139  K 3.0*  CL 101  CO2 26  GLUCOSE 120*  BUN 17  CREATININE 0.62  CALCIUM 9.8   GFR: Estimated Creatinine Clearance: 45.6 mL/min (by C-G formula based on SCr of 0.62 mg/dL). Liver Function Tests: Recent Labs  Lab 10/03/19 0943  AST 20  ALT 25  ALKPHOS 76  BILITOT 0.3  PROT 7.7  ALBUMIN 4.2   Recent Labs  Lab 10/03/19 0943  LIPASE 29   No results for input(s): AMMONIA in the last 168 hours. Coagulation Profile: No results for input(s): INR, PROTIME in the last 168 hours. Cardiac Enzymes: No results for input(s): CKTOTAL, CKMB, CKMBINDEX, TROPONINI in the last 168 hours. BNP (last 3 results) No results for input(s): PROBNP in the last 8760 hours. HbA1C: No results for input(s): HGBA1C in the last 72 hours. CBG: Recent Labs  Lab 10/03/19 1002  GLUCAP 114*   Lipid Profile: No results for input(s): CHOL, HDL, LDLCALC, TRIG, CHOLHDL, LDLDIRECT in the last 72 hours. Thyroid Function Tests: No results for input(s): TSH, T4TOTAL, FREET4, T3FREE, THYROIDAB in the last 72 hours. Anemia Panel: No results for input(s): VITAMINB12, FOLATE, FERRITIN, TIBC, IRON, RETICCTPCT in the last 72 hours. Urine analysis:    Component Value Date/Time   COLORURINE STRAW (A) 10/03/2019 1046   APPEARANCEUR CLEAR 10/03/2019 1046   LABSPEC 1.005 10/03/2019 1046   PHURINE 8.0 10/03/2019 1046   GLUCOSEU NEGATIVE 10/03/2019 1046   HGBUR SMALL (A) 10/03/2019 1046   BILIRUBINUR NEGATIVE 10/03/2019 1046    KETONESUR 5 (A) 10/03/2019 1046   PROTEINUR NEGATIVE 10/03/2019 1046   NITRITE NEGATIVE 10/03/2019 1046   LEUKOCYTESUR NEGATIVE 10/03/2019 1046    Radiological Exams on Admission: MR 3D Recon At Scanner  Result Date: 10/03/2019 CLINICAL DATA:  Gallstones. Evaluate for choledocholithiasis EXAM: MRI ABDOMEN WITHOUT AND WITH CONTRAST (INCLUDING MRCP) TECHNIQUE: Multiplanar multisequence MR imaging of the abdomen was performed both before and after the administration of intravenous contrast. Heavily T2-weighted images of the biliary and pancreatic ducts were obtained, and three-dimensional MRCP images were rendered by post processing. CONTRAST:  38mL GADAVIST GADOBUTROL 1 MMOL/ML IV SOLN COMPARISON:  None. FINDINGS: Lower chest: No acute findings. Hepatobiliary: There is mild diffuse hepatic steatosis. No suspicious liver abnormality identified. Multiple stones are identified within the gallbladder measuring up to 1.1 cm. Diffuse gallbladder wall thickening is identified measuring up to 7 mm in thickness, image 28/9. Mild pericholecystic fluid is identified. The common bile duct measures 5 mm in thickness,  image 13/11. Pancreas:  No obstructing stone or mass identified. Spleen:  Within normal limits in size and appearance. Adrenals/Urinary Tract: Normal appearance of the adrenal glands. No kidney mass identified. 6 mm T2 hyperintense lesion arising from posterior cortex of the right mid kidney is too small to characterize but likely represents a small cyst. Stomach/Bowel: Visualized portions within the abdomen are unremarkable. Vascular/Lymphatic: No pathologically enlarged lymph nodes identified. No abdominal aortic aneurysm demonstrated. Other:  No free fluid or fluid collections. Musculoskeletal: No suspicious bone lesions identified. IMPRESSION: 1. Gallstones and gallbladder wall thickening concerning for acute cholecystitis. 2. Mild increase caliber of the common bile duct measuring 5 mm. No obstructing  stone or mass identified. 3. Hepatic steatosis. Electronically Signed   By: Signa Kell M.D.   On: 10/03/2019 14:56   US Abdomen Limited  Result Date: 10/03/2019 CLINICAL DATA:  Mid epigastric pain and nausea for 3 months EXAM: ULTRASOUND ABDOMEN LIMITED RIGHT UPPER QUADRANT COMPARISON:  MRA of the abdomen July 07, 2007 FINDINGS: Gallbladder: Cholelithiasis with gallbladder wall thickening. No reported tenderness over the gallbladder. Largest calculus in the gallbladder at 1.5 cm with multiple layering calculi. Common bile duct: Diameter: 8.3 mm Liver: Generalized increased echogenicity and coarsened hepatic echotexture, suggestion of nodular surface contour. Portal vein is patent on color Doppler imaging with normal direction of blood flow towards the liver. Other: No ascites. IMPRESSION: 1. Cholelithiasis and gallbladder wall thickening without reported tenderness over the gallbladder. Findings are equivocal for cholecystitis but suspicious particularly given biliary ductal distension. 2. MRI/MRCP could be performed to exclude ductal stone. HIDA scan could also potentially be of benefit in this patient to exclude the possibility of cholecystitis and ductal obstruction. 3. Hepatic steatosis with question of nodular liver contour. Underlying liver disease is considered, correlate clinically. Electronically Signed   By: Donzetta Kohut M.D.   On: 10/03/2019 10:36   MR ABDOMEN MRCP W WO CONTAST  Result Date: 10/03/2019 CLINICAL DATA:  Gallstones. Evaluate for choledocholithiasis EXAM: MRI ABDOMEN WITHOUT AND WITH CONTRAST (INCLUDING MRCP) TECHNIQUE: Multiplanar multisequence MR imaging of the abdomen was performed both before and after the administration of intravenous contrast. Heavily T2-weighted images of the biliary and pancreatic ducts were obtained, and three-dimensional MRCP images were rendered by post processing. CONTRAST:  35mL GADAVIST GADOBUTROL 1 MMOL/ML IV SOLN COMPARISON:  None. FINDINGS:  Lower chest: No acute findings. Hepatobiliary: There is mild diffuse hepatic steatosis. No suspicious liver abnormality identified. Multiple stones are identified within the gallbladder measuring up to 1.1 cm. Diffuse gallbladder wall thickening is identified measuring up to 7 mm in thickness, image 28/9. Mild pericholecystic fluid is identified. The common bile duct measures 5 mm in thickness, image 13/11. Pancreas:  No obstructing stone or mass identified. Spleen:  Within normal limits in size and appearance. Adrenals/Urinary Tract: Normal appearance of the adrenal glands. No kidney mass identified. 6 mm T2 hyperintense lesion arising from posterior cortex of the right mid kidney is too small to characterize but likely represents a small cyst. Stomach/Bowel: Visualized portions within the abdomen are unremarkable. Vascular/Lymphatic: No pathologically enlarged lymph nodes identified. No abdominal aortic aneurysm demonstrated. Other:  No free fluid or fluid collections. Musculoskeletal: No suspicious bone lesions identified. IMPRESSION: 1. Gallstones and gallbladder wall thickening concerning for acute cholecystitis. 2. Mild increase caliber of the common bile duct measuring 5 mm. No obstructing stone or mass identified. 3. Hepatic steatosis. Electronically Signed   By: Signa Kell M.D.   On: 10/03/2019 14:56    EKG:  Independently reviewed.  Sinus rhythm with right bundle branch block (old)  Assessment/Plan Active Problems:   Acute cholecystitis     1. Acute cholecystitis.  Discussed with general surgery who will see the patient in the morning.  Will likely need cholecystectomy.  Keep on clear liquids for now. 2. Presyncopal episodes.  Suspect this is related to dehydration.  Cardiac enzymes have been negative and she has not had any EKG changes.  Overall she does feel better after IV fluids. 3. Hypertension.  Blood pressures are soft.  We will continue to hold antihypertensives including amlodipine  and lisinopril for now. 4. Hypokalemia.  Likely related to decreased p.o. intake.  We will replace and check magnesium 5. GERD.  Per patient, positive for H. pylori.  She did not tolerate antibiotics.  Will hold for now.  GI following.  Continue on PPI.  DVT prophylaxis: Lovenox Code Status: Full code Family Communication: Discussed with daughter at the bedside Disposition Plan: Discharge home post cholecystectomy Consults called: General surgery, gastroenterology Admission status: Inpatient, telemetry  Erick Blinks MD [facility]   If 7PM-7AM, please contact night-coverage [email or web address]   10/03/2019, 7:50 PM

## 2019-10-03 NOTE — ED Provider Notes (Signed)
Felicia Whitehead   CSN: [id number] Arrival date & time: 10/03/19  4259     History Chief Complaint  Patient presents with  . Weakness    [place] is a 84 y.o. female with a history of hypertension who is also currently being treated for presumptive H. pylori as Felicia Whitehead was placed on clarithromycin and amoxicillin 2 weeks ago by her PCP.  Patient states however Felicia Whitehead does not know why Felicia Whitehead was put on this medication.  Felicia Whitehead was seen here on April 13 with complaints of epigastric pain which was postprandial and continues to have episodic pain but not in over a week.  Her main complaint today is generalized weakness, shakiness, and had a near syncopal event prior to arrival.  Felicia Whitehead was eating breakfast, became hot, weak and had to lay her head down on the table until the episode passed.  Felicia Whitehead also reports reduced appetite and generalized fatigue.  Felicia Whitehead has had no fevers or chills, also denies chest pain, palpitations or shortness of breath.  Felicia Whitehead believes Felicia Whitehead is "overdosed" on these antibiotics and that they are too strong for her. Felicia Whitehead reports having increased generalized shaking which is new since being placed on the antibiotics.  The history is provided by the patient.       Past Medical History:  Diagnosis Date  . Hypertension     There are no problems to display for this patient.   History reviewed. No pertinent surgical history.   OB History   No obstetric history on file.     No family history on file.  Social History   Tobacco Use  . Smoking status: Never Smoker  . Smokeless tobacco: Never Used  Substance Use Topics  . Alcohol use: Never  . Drug use: Never    Home Medications Prior to Admission medications   Medication Sig Start Date End Date Taking? Authorizing Provider  amLODipine (NORVASC) 10 MG tablet  08/24/19  Yes [provider]  amoxicillin (AMOXIL) 500 MG capsule Take 1,000 mg by mouth 2 (two) times daily. 09/26/19   Yes [provider]  benazepril (LOTENSIN) 5 MG tablet Take 5 mg by mouth daily. 08/24/19  Yes [provider]  clarithromycin (BIAXIN) 500 MG tablet Take 500 mg by mouth 2 (two) times daily. 09/26/19  Yes [provider]  FLUoxetine (PROZAC) 20 MG capsule Take 20 mg by mouth daily. 09/21/19  Yes [provider]  lansoprazole (PREVACID) 30 MG capsule Take 30 mg by mouth 2 (two) times daily. 09/26/19  Yes [provider]  pantoprazole (PROTONIX) 40 MG tablet Take 1 tablet (40 mg total) by mouth daily. Patient not taking: Reported on 5/63/8756 4/33/29   Delora Fuel, MD    Allergies    Claritin-d 24 hour [loratadine-pseudoephedrine er]  Review of Systems   Review of Systems  Constitutional: Positive for diaphoresis and fatigue. Negative for fever.  HENT: Negative for congestion and sore throat.   Eyes: Negative.   Respiratory: Negative for chest tightness and shortness of breath.   Cardiovascular: Negative for chest pain.  Gastrointestinal: Positive for abdominal pain. Negative for nausea and vomiting.  Genitourinary: Negative.   Musculoskeletal: Negative for arthralgias, joint swelling and neck pain.  Skin: Negative.  Negative for rash and wound.  Neurological: Positive for tremors and weakness. Negative for dizziness, light-headedness, numbness and headaches.  Psychiatric/Behavioral: Negative.     Physical Exam Updated Vital Signs BP 137/63   Pulse 74   Temp 98.1  F (36.7 C) (Oral)   Resp 13   Ht 5\' 3"  (1.6 m)   Wt 62 kg   SpO2 99%   BMI 24.21 kg/m   Physical Exam Vitals and nursing Whitehead reviewed.  Constitutional:      Appearance: Felicia Whitehead is well-developed.  HENT:     Head: Normocephalic and atraumatic.     Mouth/Throat:     Mouth: Mucous membranes are moist.  Eyes:     Extraocular Movements: Extraocular movements intact.     Conjunctiva/sclera: Conjunctivae normal.     Pupils: Pupils are equal, round, and reactive to light.    Cardiovascular:     Rate and Rhythm: Normal rate and regular rhythm.     Heart sounds: Normal heart sounds.  Pulmonary:     Effort: Pulmonary effort is normal.     Breath sounds: Normal breath sounds. No wheezing.  Abdominal:     General: Bowel sounds are normal. There is no distension.     Palpations: Abdomen is soft.     Tenderness: There is no abdominal tenderness. There is no guarding.  Musculoskeletal:        General: Normal range of motion.     Cervical back: Normal range of motion.  Skin:    General: Skin is warm and dry.  Neurological:     General: No focal deficit present.     Mental Status: Felicia Whitehead is alert.     Cranial Nerves: No cranial nerve deficit.     Sensory: No sensory deficit.     Comments: Tremulous hands.   Psychiatric:        Mood and Affect: Mood is anxious.     ED Results / Procedures / Treatments   Labs (all labs ordered are listed, but only abnormal results are displayed) Labs Reviewed  BASIC METABOLIC PANEL - Abnormal; Notable for the following components:      Result Value   Potassium 3.0 (*)    Glucose, Bld 120 (*)    All other components within normal limits  URINALYSIS, ROUTINE W REFLEX MICROSCOPIC - Abnormal; Notable for the following components:   Color, Urine STRAW (*)    Hgb urine dipstick SMALL (*)    Ketones, ur 5 (*)    All other components within normal limits  CBG MONITORING, ED - Abnormal; Notable for the following components:   Glucose-Capillary 114 (*)    All other components within normal limits  RESPIRATORY PANEL BY RT PCR (FLU A&B, COVID)  CBC WITH DIFFERENTIAL/PLATELET  HEPATIC FUNCTION PANEL  LIPASE, BLOOD    EKG None  Radiology Abdomen Limited  Result Date: 10/03/2019 CLINICAL DATA:  Mid epigastric pain and nausea for 3 months EXAM: ULTRASOUND ABDOMEN LIMITED RIGHT UPPER QUADRANT COMPARISON:  MRA of the abdomen July 07, 2007 FINDINGS: Gallbladder: Cholelithiasis with gallbladder wall thickening. No reported  tenderness over the gallbladder. Largest calculus in the gallbladder at 1.5 cm with multiple layering calculi. Common bile duct: Diameter: 8.3 mm Liver: Generalized increased echogenicity and coarsened hepatic echotexture, suggestion of nodular surface contour. Portal vein is patent on color Doppler imaging with normal direction of blood flow towards the liver. Other: No ascites. IMPRESSION: 1. Cholelithiasis and gallbladder wall thickening without reported tenderness over the gallbladder. Findings are equivocal for cholecystitis but suspicious particularly given biliary ductal distension. 2. MRI/MRCP could be performed to exclude ductal stone. HIDA scan could also potentially be of benefit in this patient to exclude the possibility of cholecystitis and ductal obstruction. 3. Hepatic steatosis  with question of nodular liver contour. Underlying liver disease is considered, correlate clinically. Electronically Signed   By: Donzetta Kohut M.D.   On: 10/03/2019 10:36    Procedures Procedures (including critical care time)  Medications Ordered in ED Medications  sodium chloride flush (NS) 0.9 % injection 3 mL (3 mLs Intravenous Given 10/03/19 0944)  sodium chloride 0.9 % bolus 1,000 mL (1,000 mLs Intravenous New Bag/Given 10/03/19 1128)  potassium chloride SA (KLOR-CON) CR tablet 20 mEq (20 mEq Oral Given 10/03/19 1128)    ED Course  I have reviewed the triage vital signs and the nursing notes.  Pertinent labs & imaging results that were available during my care of the patient were reviewed by me and considered in my medical decision making (see chart for details).    MDM Rules/Calculators/A&P                      Felicia Whitehead's labs and imaging reviewed and discussed with Felicia Whitehead and Dg who is now at the bedside.  Felicia Whitehead states Felicia Whitehead became lightheaded and almost passed out here when Felicia Whitehead walked to the bathroom.  Her VS are stable, no hypotension or tachycardia.  Orthostatics obtained and Felicia Whitehead is tilt negative but feels  lightheaded when stands,  IV fluids ordered.  [place] suggesting equivocal cholecystitis with rec for MRI/MRCP to eval for ductal stone. Labs stable including normal LFT's and lipase.  ekg stable, rbbb, unchanged.  No chest pain, no sob.  VSS.   Discussed patient with Dr. Jena Gauss of GI.  With ultrasound findings but normal LFTs, he does agree with MRCP at this time, however if Felicia Whitehead requires an ERCP Felicia Whitehead will need to be transferred to [facility] for this procedure as this will not be available here until Monday with Dr. Karilyn Cota.  Given normal LFTs and lipase, suspect the biliary duct is probably patent, given her age it is not particularly distended.  Felicia Whitehead also seen by Dr. Jodi Mourning who concurs with admission.   Discussed with Dr. Kerry Hough who accepts Felicia Whitehead for admission. Final Clinical Impression(s) / ED Diagnoses Final diagnoses:  Weakness  Near syncope  Gallstones    Rx / DC Orders ED Discharge Orders    None       Victoriano Lain 10/03/19 1348    Blane Ohara, MD 10/04/19 1549

## 2019-10-03 NOTE — Consult Note (Addendum)
Referring Provider: ER Provider Primary Care Physician:  Jake Samples, PA-C Primary Gastroenterologist:  Dr. Gala Romney (previously unassigned)  Date of Admission: 10/03/19 Date of Consultation: 10/03/19  Reason for Consultation:  Abdominal pain, cholelithiasis, dilated CBD  HPI:  Felicia Whitehead is a 84 y.o. female with a past medical history of hypertension currently on antibiotics for presumptive H pylori infection (clarithromycin and amoxicillin x2 weeks by primary care).  Previous ER visit 09/18/2019 for epigastric pain, worse postprandially.  However, no pain in 1 week.  She notes generalized weakness, shakiness and near syncopal episode at breakfast.  Noted reduced appetite and generalized fatigue.  No fevers or chills.  Feels she is on too much antibiotics.  In the emergency room she felt like she was about to pass out, although her orthostatics and other vitals are essentially normal.  Labs including BMP, CBC, HFP, lipase are all normal other than mild hypokalemia at 3.0.    Right upper quadrant ultrasound completed which shows cholelithiasis with largest stone measuring 1.5 cm and multiple layering stones, gallbladder wall thickening without right upper quadrant tenderness.  Findings essentially equivocal for cholecystitis.  Some CBD dilation at 8.5 mm.  Recommended MRI/MRCP versus HIDA scan.  Also noted hepatic steatosis with question of nodular contour, correlate clinically.  Today she states she is doing okay overall.  She did have epigastric pain a couple weeks ago but has not had any in the past week.  Also denies nausea and vomiting.  Denies hematochezia or melena.  Her biggest complaint was the lightheadedness, dizziness, near syncope at home and again in the emergency room.  Vitals have been stable.  She has been on antibiotics for presumed H pylori infection (apparently by primary care with clarithromycin and amoxicillin x2 weeks).  She feels she was taken to many antibiotics  and this is the cause of her symptoms.  She did have diarrhea on antibiotics for the first couple days, but this resolved.  Since then she has mostly just "not had an appetite, not felt great" on the antibiotics.  No reflux symptoms.  Denies any NSAIDs or aspirin powders.  No other overt GI complaints.  Past Medical History:  Diagnosis Date  . Hypertension     History reviewed. No pertinent surgical history.  Prior to Admission medications   Medication Sig Start Date End Date Taking? Authorizing Provider  amLODipine (NORVASC) 10 MG tablet  08/24/19  Yes [provider]  amoxicillin (AMOXIL) 500 MG capsule Take 1,000 mg by mouth 2 (two) times daily. 09/26/19  Yes [provider]  benazepril (LOTENSIN) 5 MG tablet Take 5 mg by mouth daily. 08/24/19  Yes [provider]  clarithromycin (BIAXIN) 500 MG tablet Take 500 mg by mouth 2 (two) times daily. 09/26/19  Yes [provider]  FLUoxetine (PROZAC) 20 MG capsule Take 20 mg by mouth daily. 09/21/19  Yes [provider]  lansoprazole (PREVACID) 30 MG capsule Take 30 mg by mouth 2 (two) times daily. 09/26/19  Yes [provider]  pantoprazole (PROTONIX) 40 MG tablet Take 1 tablet (40 mg total) by mouth daily. Patient not taking: Reported on 6/96/2952 8/41/32   Delora Fuel, MD    No current facility-administered medications for this encounter.   Current Outpatient Medications  Medication Sig Dispense Refill  . amLODipine (NORVASC) 10 MG tablet     . amoxicillin (AMOXIL) 500 MG capsule Take 1,000 mg by mouth 2 (two) times daily.    . benazepril (LOTENSIN) 5 MG tablet  Take 5 mg by mouth daily.    . clarithromycin (BIAXIN) 500 MG tablet Take 500 mg by mouth 2 (two) times daily.    Marland Kitchen FLUoxetine (PROZAC) 20 MG capsule Take 20 mg by mouth daily.    . lansoprazole (PREVACID) 30 MG capsule Take 30 mg by mouth 2 (two) times daily.    . pantoprazole (PROTONIX) 40 MG tablet Take 1 tablet (40 mg total) by  mouth daily. (Patient not taking: Reported on 10/03/2019) 30 tablet 0    Allergies as of 10/03/2019 - Review Complete 10/03/2019  Allergen Reaction Noted  . Claritin-d 24 hour [loratadine-pseudoephedrine er]  10/03/2019    No family history on file.  Social History   Socioeconomic History  . Marital status: Widowed    Spouse name: Not on file  . Number of children: Not on file  . Years of education: Not on file  . Highest education level: Not on file  Occupational History  . Not on file  Tobacco Use  . Smoking status: Never Smoker  . Smokeless tobacco: Never Used  Substance and Sexual Activity  . Alcohol use: Never  . Drug use: Never  . Sexual activity: Not on file  Other Topics Concern  . Not on file  Social History Narrative  . Not on file   Social Determinants of Health   Financial Resource Strain:   . Difficulty of Paying Living Expenses:   Food Insecurity:   . Worried About Programme researcher, broadcasting/film/video in the Last Year:   . Barista in the Last Year:   Transportation Needs:   . Freight forwarder (Medical):   Marland Kitchen Lack of Transportation (Non-Medical):   Physical Activity:   . Days of Exercise per Week:   . Minutes of Exercise per Session:   Stress:   . Feeling of Stress :   Social Connections:   . Frequency of Communication with Friends and Family:   . Frequency of Social Gatherings with Friends and Family:   . Attends Religious Services:   . Active Member of Clubs or Organizations:   . Attends Banker Meetings:   Marland Kitchen Marital Status:   Intimate Partner Violence:   . Fear of Current or Ex-Partner:   . Emotionally Abused:   Marland Kitchen Physically Abused:   . Sexually Abused:     Review of Systems: General: Negative for anorexia, weight loss, fever, chills, fatigue, weakness. ENT: Negative for hoarseness, difficulty swallowing. CV: Negative for chest pain, angina, palpitations, peripheral edema.  Respiratory: Negative for dyspnea at rest, cough,  sputum, wheezing.  GI: See history of present illness. Endo: Negative for unusual weight change.  Heme: Negative for bruising or bleeding. Allergy: Negative for rash or hives.  Physical Exam: Vital signs in last 24 hours: Temp:  [98.1 F (36.7 C)] 98.1 F (36.7 C) (04/28 0833) Pulse Rate:  [66-83] 74 (04/28 1200) Resp:  [12-20] 13 (04/28 1200) BP: (124-146)/(51-69) 137/63 (04/28 1200) SpO2:  [97 %-100 %] 99 % (04/28 1200) Weight:  [62 kg] 62 kg (04/28 0830)   General:   Alert,  Well-developed, well-nourished, pleasant and cooperative in NAD Head:  Normocephalic and atraumatic. Eyes:  Sclera clear, no icterus.   Conjunctiva pink. Ears:  Normal auditory acuity. Neck:  Supple; no masses or thyromegaly. Lungs:  Clear throughout to auscultation. No wheezes, crackles, or rhonchi. No acute distress. Heart:  Regular rate and rhythm; no murmurs, clicks, rubs,  or gallops. Abdomen:  Soft, nontender and nondistended. No  masses, hepatosplenomegaly or hernias noted. Normal bowel sounds, without guarding, and without rebound.   Rectal:  Deferred.   Msk:  Symmetrical without gross deformities. Pulses:  Normal bilateral DP pulses noted. Extremities:  Without clubbing or edema. Neurologic:  Alert and  oriented x4;  grossly normal neurologically. Psych:  Alert and cooperative. Normal mood and affect.  Intake/Output from previous day: No intake/output data recorded. Intake/Output this shift: No intake/output data recorded.  Lab Results: Recent Labs    10/03/19 0943  WBC 7.7  HGB 14.6  HCT 42.3  PLT 319   BMET Recent Labs    10/03/19 0943  NA 139  K 3.0*  CL 101  CO2 26  GLUCOSE 120*  BUN 17  CREATININE 0.62  CALCIUM 9.8   LFT Recent Labs    10/03/19 0943  PROT 7.7  ALBUMIN 4.2  AST 20  ALT 25  ALKPHOS 76  BILITOT 0.3  BILIDIR <0.1  IBILI NOT CALCULATED   PT/INR No results for input(s): LABPROT, INR in the last 72 hours. Hepatitis Panel No results for input(s):  HEPBSAG, HCVAB, HEPAIGM, HEPBIGM in the last 72 hours. C-Diff No results for input(s): CDIFFTOX in the last 72 hours.  Studies/Results: US Abdomen Limited  Result Date: 10/03/2019 CLINICAL DATA:  Mid epigastric pain and nausea for 3 months EXAM: ULTRASOUND ABDOMEN LIMITED RIGHT UPPER QUADRANT COMPARISON:  MRA of the abdomen July 07, 2007 FINDINGS: Gallbladder: Cholelithiasis with gallbladder wall thickening. No reported tenderness over the gallbladder. Largest calculus in the gallbladder at 1.5 cm with multiple layering calculi. Common bile duct: Diameter: 8.3 mm Liver: Generalized increased echogenicity and coarsened hepatic echotexture, suggestion of nodular surface contour. Portal vein is patent on color Doppler imaging with normal direction of blood flow towards the liver. Other: No ascites. IMPRESSION: 1. Cholelithiasis and gallbladder wall thickening without reported tenderness over the gallbladder. Findings are equivocal for cholecystitis but suspicious particularly given biliary ductal distension. 2. MRI/MRCP could be performed to exclude ductal stone. HIDA scan could also potentially be of benefit in this patient to exclude the possibility of cholecystitis and ductal obstruction. 3. Hepatic steatosis with question of nodular liver contour. Underlying liver disease is considered, correlate clinically. Electronically Signed   By: Donzetta Kohut M.D.   On: 10/03/2019 10:36    Impression: Pleasant 84 year old female who presented with weakness, shakiness, near syncope.  Previous complaints of epigastric pain, although this seems to have subsided in the past week.  Right upper quadrant ultrasound found cholelithiasis and likely cholecystitis with mildly dilated CBD, although LFTs and lipase are normal and doubt stone obstruction at this time.  However, an MRI/MRCP has been ordered to evaluate for ductal stone.  This found essentially normal CBD diameter of 5 mm.  Again demonstrated cholelithiasis  with gallbladder wall thickening consistent with acute cholecystitis.  Query the possibility that her previous epigastric pain was related to cholecystitis that has had some improvement on antibiotics for presumed H. pylori infection.  It is quite interesting that she essentially has acute cholecystitis and is generally asymptomatic from a GI standpoint.  Additionally, while she does not have abdominal pain, she does have decreased appetite and general/vague "GI upset" that could be related to her cholecystitis.  She feels her lightheadedness and near syncope could be related to not eating much recently because of her above symptoms.  Plan: 1. Monitor LFTs 2. Consider surgical consult  3. Pain and nausea management per ER/hospitalist providers 4. Ok to start clear liquids from a GI  standpoint, advance as tolerated (pending surgical opinion) 5. Further recommendations to follow   Thank you for allowing [place] to participate in the care of [place] L Fosdick  Wynne Dust, DNP, AGNP-C Adult & Gerontological Nurse Practitioner [facility]   LOS: 0 days     10/03/2019, 12:56 PM

## 2019-10-03 NOTE — ED Triage Notes (Signed)
Pt reports was diagnosed with an infection in her stomach and is taking clarithromycin and amoxicillin.  Reports today was sitting at her table, had chills, and near syncopal episode.  Denies any pain.  Pt says feels weak all over and "shakey."

## 2019-10-04 DIAGNOSIS — A048 Other specified bacterial intestinal infections: Secondary | ICD-10-CM

## 2019-10-04 DIAGNOSIS — R55 Syncope and collapse: Secondary | ICD-10-CM | POA: Diagnosis present

## 2019-10-04 DIAGNOSIS — K810 Acute cholecystitis: Secondary | ICD-10-CM | POA: Diagnosis not present

## 2019-10-04 DIAGNOSIS — K8020 Calculus of gallbladder without cholecystitis without obstruction: Secondary | ICD-10-CM

## 2019-10-04 DIAGNOSIS — K8000 Calculus of gallbladder with acute cholecystitis without obstruction: Secondary | ICD-10-CM | POA: Diagnosis not present

## 2019-10-04 DIAGNOSIS — R1013 Epigastric pain: Secondary | ICD-10-CM | POA: Diagnosis not present

## 2019-10-04 DIAGNOSIS — K219 Gastro-esophageal reflux disease without esophagitis: Secondary | ICD-10-CM | POA: Diagnosis not present

## 2019-10-04 DIAGNOSIS — I10 Essential (primary) hypertension: Secondary | ICD-10-CM | POA: Diagnosis not present

## 2019-10-04 LAB — COMPREHENSIVE METABOLIC PANEL
ALT: 20 U/L (ref 0–44)
AST: 18 U/L (ref 15–41)
Albumin: 3.7 g/dL (ref 3.5–5.0)
Alkaline Phosphatase: 61 U/L (ref 38–126)
Anion gap: 9 (ref 5–15)
BUN: 12 mg/dL (ref 8–23)
CO2: 23 mmol/L (ref 22–32)
Calcium: 9 mg/dL (ref 8.9–10.3)
Chloride: 107 mmol/L (ref 98–111)
Creatinine, Ser: 0.6 mg/dL (ref 0.44–1.00)
GFR calc Af Amer: >60 mL/min (ref >60)
GFR calc non Af Amer: >60 mL/min (ref >60)
Glucose, Bld: 101 mg/dL — ABNORMAL HIGH (ref 70–99)
Potassium: 3.4 mmol/L — ABNORMAL LOW (ref 3.5–5.1)
Sodium: 139 mmol/L (ref 135–145)
Total Bilirubin: 0.7 mg/dL (ref 0.3–1.2)
Total Protein: 6.4 g/dL — ABNORMAL LOW (ref 6.5–8.1)

## 2019-10-04 LAB — CBC
HCT: 38.9 % (ref 36.0–46.0)
Hemoglobin: 13 g/dL (ref 12.0–15.0)
MCH: 31.2 pg (ref 26.0–34.0)
MCHC: 33.4 g/dL (ref 30.0–36.0)
MCV: 93.3 fL (ref 80.0–100.0)
Platelets: 296 10*3/uL (ref 150–400)
RBC: 4.17 MIL/uL (ref 3.87–5.11)
RDW: 12.5 % (ref 11.5–15.5)
WBC: 6.9 10*3/uL (ref 4.0–10.5)
nRBC: 0 % (ref 0.0–0.2)

## 2019-10-04 LAB — MAGNESIUM: Magnesium: 2.2 mg/dL (ref 1.7–2.4)

## 2019-10-04 NOTE — Consult Note (Signed)
Reason for Consult: Gallstones Referring Physician: Dr. Raymondo Band is an 84 y.o. female.  HPI:  Felicia Whitehead is an 84 year old female with a history of recently diagnosed H. Pylori gastritis admitted for syncope found to have gall stones on ultrasound.  2 weeks ago the patient had an episode of epigastric pain that radiated to her chest that startyed suddenly while eating a hot dog. She went to the ED and was felt to have GERD; she then wen to her PCP and was diagnosed with H. Pylori and started on triple therapy with PPI, amoxacillin, and clarithromycin. She then returned to the ED yesterday due to a near syncopal episode that occurred with standing. She denies any RUQ pain, abdominal pain associated with foods since episode 2 weeks ago, any further nausea or vomiting since episode 2 weeks ago, pain radiating to her back or shoulder, palpitations.  She did have dizziness, nausea, and diaphoresis associated with her syncopal episode.   Past Medical History:  Diagnosis Date  . Hypertension     History reviewed. No pertinent surgical history.  No family history on file.  Social History:  reports that she has never smoked. She has never used smokeless tobacco. She reports that she does not drink alcohol or use drugs.  Allergies:  Allergies  Allergen Reactions  . Claritin-D 24 Hour [Loratadine-Pseudoephedrine Er]     Medications: Current Outpatient Medications  Medication Instructions  . amLODipine (NORVASC) 10 MG tablet No dose, route, or frequency recorded.  Marland Kitchen amoxicillin (AMOXIL) 1,000 mg, Oral, 2 times daily  . benazepril (LOTENSIN) 5 mg, Oral, Daily  . clarithromycin (BIAXIN) 500 mg, Oral, 2 times daily  . FLUoxetine (PROZAC) 20 mg, Oral, Daily  . lansoprazole (PREVACID) 30 mg, Oral, 2 times daily  . pantoprazole (PROTONIX) 40 mg, Oral, Daily    Results for orders placed or performed during the hospital encounter of 10/03/19 (from the past 48 hour(s))   Basic metabolic panel     Status: Abnormal   Collection Time: 10/03/19  9:43 AM  Result Value Ref Range   Sodium 139 135 - 145 mmol/L   Potassium 3.0 (L) 3.5 - 5.1 mmol/L   Chloride 101 98 - 111 mmol/L   CO2 26 22 - 32 mmol/L   Glucose, Bld 120 (H) 70 - 99 mg/dL    Comment: Glucose reference range applies only to samples taken after fasting for at least 8 hours.   BUN 17 8 - 23 mg/dL   Creatinine, Ser 0.62 0.44 - 1.00 mg/dL   Calcium 9.8 8.9 - 10.3 mg/dL   GFR calc non Af Amer >60 >60 mL/min   GFR calc Af Amer >60 >60 mL/min   Anion gap 12 5 - 15    Comment: Performed at [facility], [address], [place], [place] [address]  CBC with Differential     Status: None   Collection Time: 10/03/19  9:43 AM  Result Value Ref Range   WBC 7.7 4.0 - 10.5 K/uL   RBC 4.67 3.87 - 5.11 MIL/uL   Hemoglobin 14.6 12.0 - 15.0 g/dL   HCT 42.3 36.0 - 46.0 %   MCV 90.6 80.0 - 100.0 fL   MCH 31.3 26.0 - 34.0 pg   MCHC 34.5 30.0 - 36.0 g/dL   RDW 12.0 11.5 - 15.5 %   Platelets 319 150 - 400 K/uL   nRBC 0.0 0.0 - 0.2 %   Neutrophils Relative % 76 %  Neutro Abs 5.9 1.7 - 7.7 K/uL   Lymphocytes Relative 13 %   Lymphs Abs 1.0 0.7 - 4.0 K/uL   Monocytes Relative 8 %   Monocytes Absolute 0.6 0.1 - 1.0 K/uL   Eosinophils Relative 1 %   Eosinophils Absolute 0.1 0.0 - 0.5 K/uL   Basophils Relative 1 %   Basophils Absolute 0.1 0.0 - 0.1 K/uL   Immature Granulocytes 1 %   Abs Immature Granulocytes 0.04 0.00 - 0.07 K/uL    Comment: Performed at [facility], [address] [place], [place] [address]  Hepatic function panel     Status: None   Collection Time: 10/03/19  9:43 AM  Result Value Ref Range   Total Protein 7.7 6.5 - 8.1 g/dL   Albumin 4.2 3.5 - 5.0 g/dL   AST 20 15 - 41 U/L   ALT 25 0 - 44 U/L   Alkaline Phosphatase 76 38 - 126 U/L   Total Bilirubin 0.3 0.3 - 1.2 mg/dL   Bilirubin, Direct <9.5 0.0 - 0.2 mg/dL   Indirect Bilirubin NOT CALCULATED 0.3 - 0.9 mg/dL    Comment: Performed at  [facility], [address], [place], [place] [address]  Lipase, blood     Status: None   Collection Time: 10/03/19  9:43 AM  Result Value Ref Range   Lipase 29 11 - 51 U/L    Comment: Performed at [facility], [address] [place], [place] [address]  Troponin I (High Sensitivity)     Status: None   Collection Time: 10/03/19  9:43 AM  Result Value Ref Range   Troponin I (High Sensitivity) 3 <18 ng/L    Comment: (NOTE) Elevated high sensitivity troponin I (hsTnI) values and significant  changes across serial measurements may suggest ACS but many other  chronic and acute conditions are known to elevate hsTnI results.  Refer to the "Links" section for chest pain algorithms and additional  guidance. Performed at [facility], [address], [place], [place] [address]   CBG monitoring, ED     Status: Abnormal   Collection Time: 10/03/19 10:02 AM  Result Value Ref Range   Glucose-Capillary 114 (H) 70 - 99 mg/dL    Comment: Glucose reference range applies only to samples taken after fasting for at least 8 hours.  Urinalysis, Routine w reflex microscopic     Status: Abnormal   Collection Time: 10/03/19 10:46 AM  Result Value Ref Range   Color, Urine STRAW (A) YELLOW   APPearance CLEAR CLEAR   Specific Gravity, Urine 1.005 1.005 - 1.030   pH 8.0 5.0 - 8.0   Glucose, UA NEGATIVE NEGATIVE mg/dL   Hgb urine dipstick SMALL (A) NEGATIVE   Bilirubin Urine NEGATIVE NEGATIVE   Ketones, ur 5 (A) NEGATIVE mg/dL   Protein, ur NEGATIVE NEGATIVE mg/dL   Nitrite NEGATIVE NEGATIVE   Leukocytes,Ua NEGATIVE NEGATIVE   RBC / HPF 0-5 0 - 5 RBC/hpf   WBC, UA 0-5 0 - 5 WBC/hpf   Bacteria, UA NONE SEEN NONE SEEN   Mucus PRESENT     Comment: Performed at [facility], [address], [place], [place] [address]  Respiratory Panel by RT PCR (Flu A&B, Covid) - Nasopharyngeal Swab     Status: None   Collection Time: 10/03/19 11:51 AM   Specimen: Nasopharyngeal Swab  Result Value Ref Range   SARS  Coronavirus 2 by RT PCR NEGATIVE NEGATIVE    Comment: (NOTE) SARS-CoV-2 target nucleic acids are NOT DETECTED.  The SARS-CoV-2 RNA is generally detectable in upper respiratoy specimens during the acute phase of infection. The lowest concentration of SARS-CoV-2 viral copies this assay can detect is 131 copies/mL. A negative result does not preclude SARS-Cov-2 infection and should not be used as the sole basis for treatment or other patient management decisions. A negative result may occur with  improper specimen collection/handling, submission of specimen other than nasopharyngeal swab, presence of viral mutation(s) within the areas targeted by this assay, and inadequate number of viral copies (<131 copies/mL). A negative result must be combined with clinical observations, patient history, and epidemiological information. The expected result is Negative. Fact Sheet for Patients:  https://www.moore.com/ Fact Sheet for Healthcare Providers:  https://www.young.biz/ This test is not yet ap proved or cleared by the [place] FDA and  has been authorized for detection and/or diagnosis of SARS-CoV-2 by FDA under an Emergency Use Authorization (EUA). This EUA will remain  in effect (meaning this test can be used) for the duration of the COVID-19 declaration under Section 564(b)(1) of the Act, 21 U.S.C. section 360bbb-3(b)(1), unless the authorization is terminated or revoked sooner.    Influenza A by PCR NEGATIVE NEGATIVE   Influenza B by PCR NEGATIVE NEGATIVE    Comment: (NOTE) The Xpert Xpress SARS-CoV-2/FLU/RSV assay is intended as an aid in  the diagnosis of influenza from Nasopharyngeal swab specimens and  should not be used as a sole basis for treatment. Nasal washings and  aspirates are unacceptable for Xpert Xpress SARS-CoV-2/FLU/RSV  testing. Fact Sheet for Patients: https://www.moore.com/ Fact Sheet for Healthcare  Providers: https://www.young.biz/ This test is not yet approved or cleared by the [place] FDA and  has been authorized for detection and/or diagnosis of SARS-CoV-2 by  FDA under an Emergency Use Authorization (EUA). This EUA will remain  in effect (meaning this test can be used) for the duration of the  Covid-19 declaration under Section 564(b)(1) of the Act, 21  U.S.C. section 360bbb-3(b)(1), unless the authorization is  terminated or revoked. Performed at [facility], [address] [place], [place] [address]   Troponin I (High Sensitivity)     Status: None   Collection Time: 10/03/19  2:44 PM  Result Value Ref Range   Troponin I (High Sensitivity) 5 <18 ng/L    Comment: (NOTE) Elevated high sensitivity troponin I (hsTnI) values and significant  changes across serial measurements may suggest ACS but many other  chronic and acute conditions are known to elevate hsTnI results.  Refer to the "Links" section for chest pain algorithms and additional  guidance. Performed at [facility], [address], [place], [place] [address]   Comprehensive metabolic panel     Status: Abnormal   Collection Time: 10/04/19  5:52 AM  Result Value Ref Range   Sodium 139 135 - 145 mmol/L   Potassium 3.4 (L) 3.5 - 5.1 mmol/L   Chloride 107 98 - 111 mmol/L   CO2 23 22 - 32 mmol/L   Glucose, Bld 101 (H) 70 - 99 mg/dL    Comment: Glucose reference range applies only to samples taken after fasting for at least 8 hours.   BUN 12 8 - 23 mg/dL   Creatinine, Ser 3.47 0.44 - 1.00 mg/dL   Calcium 9.0 8.9 - 42.5 mg/dL   Total Protein 6.4 (L) 6.5 - 8.1 g/dL   Albumin 3.7 3.5 - 5.0 g/dL   AST 18 15 - 41 U/L   ALT 20 0 - 44 U/L   Alkaline Phosphatase  61 38 - 126 U/L   Total Bilirubin 0.7 0.3 - 1.2 mg/dL   GFR calc non Af Amer >60 >60 mL/min   GFR calc Af Amer >60 >60 mL/min   Anion gap 9 5 - 15    Comment: Performed at [facility], [address] [place], [place] [address]  CBC      Status: None   Collection Time: 10/04/19  5:52 AM  Result Value Ref Range   WBC 6.9 4.0 - 10.5 K/uL   RBC 4.17 3.87 - 5.11 MIL/uL   Hemoglobin 13.0 12.0 - 15.0 g/dL   HCT 19.1 47.8 - 29.5 %   MCV 93.3 80.0 - 100.0 fL   MCH 31.2 26.0 - 34.0 pg   MCHC 33.4 30.0 - 36.0 g/dL   RDW 62.1 30.8 - 65.7 %   Platelets 296 150 - 400 K/uL   nRBC 0.0 0.0 - 0.2 %    Comment: Performed at [facility], [address], [place], [place] [address]  Magnesium     Status: None   Collection Time: 10/04/19  5:52 AM  Result Value Ref Range   Magnesium 2.2 1.7 - 2.4 mg/dL    Comment: Performed at [facility], [address] [place], [place] [address]    MR 3D Recon At Scanner  Result Date: 10/03/2019 CLINICAL DATA:  Gallstones. Evaluate for choledocholithiasis EXAM: MRI ABDOMEN WITHOUT AND WITH CONTRAST (INCLUDING MRCP) TECHNIQUE: Multiplanar multisequence MR imaging of the abdomen was performed both before and after the administration of intravenous contrast. Heavily T2-weighted images of the biliary and pancreatic ducts were obtained, and three-dimensional MRCP images were rendered by post processing. CONTRAST:  7mL GADAVIST GADOBUTROL 1 MMOL/ML IV SOLN COMPARISON:  None. FINDINGS: Lower chest: No acute findings. Hepatobiliary: There is mild diffuse hepatic steatosis. No suspicious liver abnormality identified. Multiple stones are identified within the gallbladder measuring up to 1.1 cm. Diffuse gallbladder wall thickening is identified measuring up to 7 mm in thickness, image 28/9. Mild pericholecystic fluid is identified. The common bile duct measures 5 mm in thickness, image 13/11. Pancreas:  No obstructing stone or mass identified. Spleen:  Within normal limits in size and appearance. Adrenals/Urinary Tract: Normal appearance of the adrenal glands. No kidney mass identified. 6 mm T2 hyperintense lesion arising from posterior cortex of the right mid kidney is too small to characterize but likely represents a  small cyst. Stomach/Bowel: Visualized portions within the abdomen are unremarkable. Vascular/Lymphatic: No pathologically enlarged lymph nodes identified. No abdominal aortic aneurysm demonstrated. Other:  No free fluid or fluid collections. Musculoskeletal: No suspicious bone lesions identified. IMPRESSION: 1. Gallstones and gallbladder wall thickening concerning for acute cholecystitis. 2. Mild increase caliber of the common bile duct measuring 5 mm. No obstructing stone or mass identified. 3. Hepatic steatosis. Electronically Signed   By: Signa Kell M.D.   On: 10/03/2019 14:56   US Abdomen Limited  Result Date: 10/03/2019 CLINICAL DATA:  Mid epigastric pain and nausea for 3 months EXAM: ULTRASOUND ABDOMEN LIMITED RIGHT UPPER QUADRANT COMPARISON:  MRA of the abdomen July 07, 2007 FINDINGS: Gallbladder: Cholelithiasis with gallbladder wall thickening. No reported tenderness over the gallbladder. Largest calculus in the gallbladder at 1.5 cm with multiple layering calculi. Common bile duct: Diameter: 8.3 mm Liver: Generalized increased echogenicity and coarsened hepatic echotexture, suggestion of nodular surface contour. Portal vein is patent on color Doppler imaging with normal direction of blood flow towards the liver. Other: No ascites. IMPRESSION: 1. Cholelithiasis and gallbladder wall thickening without  reported tenderness over the gallbladder. Findings are equivocal for cholecystitis but suspicious particularly given biliary ductal distension. 2. MRI/MRCP could be performed to exclude ductal stone. HIDA scan could also potentially be of benefit in this patient to exclude the possibility of cholecystitis and ductal obstruction. 3. Hepatic steatosis with question of nodular liver contour. Underlying liver disease is considered, correlate clinically. Electronically Signed   By: Donzetta Kohut M.D.   On: 10/03/2019 10:36   MR ABDOMEN MRCP W WO CONTAST  Result Date: 10/03/2019 CLINICAL DATA:   Gallstones. Evaluate for choledocholithiasis EXAM: MRI ABDOMEN WITHOUT AND WITH CONTRAST (INCLUDING MRCP) TECHNIQUE: Multiplanar multisequence MR imaging of the abdomen was performed both before and after the administration of intravenous contrast. Heavily T2-weighted images of the biliary and pancreatic ducts were obtained, and three-dimensional MRCP images were rendered by post processing. CONTRAST:  64mL GADAVIST GADOBUTROL 1 MMOL/ML IV SOLN COMPARISON:  None. FINDINGS: Lower chest: No acute findings. Hepatobiliary: There is mild diffuse hepatic steatosis. No suspicious liver abnormality identified. Multiple stones are identified within the gallbladder measuring up to 1.1 cm. Diffuse gallbladder wall thickening is identified measuring up to 7 mm in thickness, image 28/9. Mild pericholecystic fluid is identified. The common bile duct measures 5 mm in thickness, image 13/11. Pancreas:  No obstructing stone or mass identified. Spleen:  Within normal limits in size and appearance. Adrenals/Urinary Tract: Normal appearance of the adrenal glands. No kidney mass identified. 6 mm T2 hyperintense lesion arising from posterior cortex of the right mid kidney is too small to characterize but likely represents a small cyst. Stomach/Bowel: Visualized portions within the abdomen are unremarkable. Vascular/Lymphatic: No pathologically enlarged lymph nodes identified. No abdominal aortic aneurysm demonstrated. Other:  No free fluid or fluid collections. Musculoskeletal: No suspicious bone lesions identified. IMPRESSION: 1. Gallstones and gallbladder wall thickening concerning for acute cholecystitis. 2. Mild increase caliber of the common bile duct measuring 5 mm. No obstructing stone or mass identified. 3. Hepatic steatosis. Electronically Signed   By: Signa Kell M.D.   On: 10/03/2019 14:56    ROS:  Review of Systems  Constitutional: Positive for diaphoresis. Negative for fever.  Respiratory: Negative for cough.    Cardiovascular: Negative for chest pain and palpitations.  Gastrointestinal: Positive for nausea. Negative for abdominal pain and vomiting.  Neurological: Positive for dizziness.    Blood pressure (!) 125/92, pulse 78, temperature 99 F (37.2 C), resp. rate 18, height 5\' 3"  (1.6 m), weight 62 kg, SpO2 98 %.  Physical Exam Cardiovascular:     Rate and Rhythm: Normal rate and regular rhythm.     Pulses: Normal pulses.     Heart sounds: Normal heart sounds.  Pulmonary:     Effort: Pulmonary effort is normal.     Breath sounds: Normal breath sounds.  Abdominal:     General: Abdomen is flat. Bowel sounds are normal. There is no distension.     Palpations: Abdomen is soft.     Tenderness: There is no abdominal tenderness.  Skin:    General: Skin is warm and dry.  Neurological:     Mental Status: She is alert and oriented to person, place, and time.    Assessment/Plan: Felicia Whitehead is a 84 year old female with a history of recently diagnosed H pylori admitted for a near syncopal episode found to have gall stones on ultrasound. MRCP was done and showed evidence of mild cholecysistis with diffuse gallbladder wall thickening anf mild pericholecystic fluid. However, common bile duct diameter was  within normal limits and LFTs were within normal limits as well decreasing any concern for choledocholithiasis. Additionally the patient has had no continued pain and her pain 2 weeks ago was more consistent with GERD. There is no need for surgical intervention at this time.  - no name for surgical intervention at this time - advance diet to heart healthy diet    [place] BlizzardKatherine N Leilanie Rauda 10/04/2019, 9:49 AM

## 2019-10-04 NOTE — Discharge Summary (Signed)
Physician Discharge Reagan DGU:[id number] DOB: [birth year] DOA: 10/03/2019  PCP: Jake Samples, PA-C  Admit date: 10/03/2019 Discharge date: 10/04/2019  Admitted From: Home Disposition: Home  Recommendations for Outpatient Follow-up:  1. Follow up with PCP in 1-2 weeks 2. Please obtain BMP/CBC in one week 3. Follow-up with general surgery in the next few weeks   Discharge Condition: Stable CODE STATUS: Full code Diet recommendation: Heart healthy  Hospital course:  1. Acute cholecystitis.    Patient had radiographic findings suggesting possible cholecystitis.  Clinically, she did not have any abdominal pain, vomiting and was tolerating solid foods.  LFTs were normal.  Seen by general surgery who did not feel that surgical intervention was warranted at this time.  She will follow up with general surgery in the next few weeks. 2. Presyncopal episodes.  Suspect this is related to dehydration.  Cardiac enzymes have been negative and she has not had any EKG changes.  Overall she does feel better after IV fluids.  She did not have any recurrence of symptoms. 3. Hypertension.    Resume lisinopril and amlodipine on discharge.. 4. Hypokalemia.    Replaced.  Magnesium normal. 5. GERD.  Per patient, positive for H. pylori.  She did not tolerate antibiotics.    Discussed with GI, will hold off on further antibiotics for now.  Continue on PPI.  Follow-up with GI as an outpatient..  Discharge Diagnoses:  Active Problems:   Abdominal pain, epigastric   Acute cholecystitis   Positive H. pylori test   Pre-syncope    Discharge Instructions  Discharge Instructions    Diet - low sodium heart healthy   Complete by: As directed    Increase activity slowly   Complete by: As directed      Allergies as of 10/04/2019      Reactions   Claritin-d 24 Hour [loratadine-pseudoephedrine Er]       Medication List    STOP taking these medications   amoxicillin 500 MG  capsule Commonly known as: AMOXIL   clarithromycin 500 MG tablet Commonly known as: BIAXIN   pantoprazole 40 MG tablet Commonly known as: PROTONIX     TAKE these medications   amLODipine 10 MG tablet Commonly known as: NORVASC   benazepril 5 MG tablet Commonly known as: LOTENSIN Take 5 mg by mouth daily.   FLUoxetine 20 MG capsule Commonly known as: PROZAC Take 20 mg by mouth daily.   lansoprazole 30 MG capsule Commonly known as: PREVACID Take 30 mg by mouth 2 (two) times daily.      Follow-up Information    Aviva Signs, MD. Call.   Specialty: General Surgery Why: Call my office to make a follow up appt in several weeks. Contact information: [address] [place] [address] [phone]        Daneil Dolin, MD. Schedule an appointment as soon as possible for a visit in 2 week(s).   Specialty: Gastroenterology Contact information: [address] [place] [address] [phone]          Allergies  Allergen Reactions  . Claritin-D 24 Hour [Loratadine-Pseudoephedrine Er]     Consultations:  Gastroenterology  General surgery   Procedures/Studies: MR 3D Recon At Scanner  Result Date: 10/03/2019 CLINICAL DATA:  Gallstones. Evaluate for choledocholithiasis EXAM: MRI ABDOMEN WITHOUT AND WITH CONTRAST (INCLUDING MRCP) TECHNIQUE: Multiplanar multisequence MR imaging of the abdomen was performed both before and after the administration of intravenous contrast. Heavily T2-weighted images of the biliary and  pancreatic ducts were obtained, and three-dimensional MRCP images were rendered by post processing. CONTRAST:  19mL GADAVIST GADOBUTROL 1 MMOL/ML IV SOLN COMPARISON:  None. FINDINGS: Lower chest: No acute findings. Hepatobiliary: There is mild diffuse hepatic steatosis. No suspicious liver abnormality identified. Multiple stones are identified within the gallbladder measuring up to 1.1 cm. Diffuse gallbladder wall thickening is identified  measuring up to 7 mm in thickness, image 28/9. Mild pericholecystic fluid is identified. The common bile duct measures 5 mm in thickness, image 13/11. Pancreas:  No obstructing stone or mass identified. Spleen:  Within normal limits in size and appearance. Adrenals/Urinary Tract: Normal appearance of the adrenal glands. No kidney mass identified. 6 mm T2 hyperintense lesion arising from posterior cortex of the right mid kidney is too small to characterize but likely represents a small cyst. Stomach/Bowel: Visualized portions within the abdomen are unremarkable. Vascular/Lymphatic: No pathologically enlarged lymph nodes identified. No abdominal aortic aneurysm demonstrated. Other:  No free fluid or fluid collections. Musculoskeletal: No suspicious bone lesions identified. IMPRESSION: 1. Gallstones and gallbladder wall thickening concerning for acute cholecystitis. 2. Mild increase caliber of the common bile duct measuring 5 mm. No obstructing stone or mass identified. 3. Hepatic steatosis. Electronically Signed   By: Signa Kell M.D.   On: 10/03/2019 14:56   US Abdomen Limited  Result Date: 10/03/2019 CLINICAL DATA:  Mid epigastric pain and nausea for 3 months EXAM: ULTRASOUND ABDOMEN LIMITED RIGHT UPPER QUADRANT COMPARISON:  MRA of the abdomen July 07, 2007 FINDINGS: Gallbladder: Cholelithiasis with gallbladder wall thickening. No reported tenderness over the gallbladder. Largest calculus in the gallbladder at 1.5 cm with multiple layering calculi. Common bile duct: Diameter: 8.3 mm Liver: Generalized increased echogenicity and coarsened hepatic echotexture, suggestion of nodular surface contour. Portal vein is patent on color Doppler imaging with normal direction of blood flow towards the liver. Other: No ascites. IMPRESSION: 1. Cholelithiasis and gallbladder wall thickening without reported tenderness over the gallbladder. Findings are equivocal for cholecystitis but suspicious particularly given biliary  ductal distension. 2. MRI/MRCP could be performed to exclude ductal stone. HIDA scan could also potentially be of benefit in this patient to exclude the possibility of cholecystitis and ductal obstruction. 3. Hepatic steatosis with question of nodular liver contour. Underlying liver disease is considered, correlate clinically. Electronically Signed   By: Donzetta Kohut M.D.   On: 10/03/2019 10:36   MR ABDOMEN MRCP W WO CONTAST  Result Date: 10/03/2019 CLINICAL DATA:  Gallstones. Evaluate for choledocholithiasis EXAM: MRI ABDOMEN WITHOUT AND WITH CONTRAST (INCLUDING MRCP) TECHNIQUE: Multiplanar multisequence MR imaging of the abdomen was performed both before and after the administration of intravenous contrast. Heavily T2-weighted images of the biliary and pancreatic ducts were obtained, and three-dimensional MRCP images were rendered by post processing. CONTRAST:  57mL GADAVIST GADOBUTROL 1 MMOL/ML IV SOLN COMPARISON:  None. FINDINGS: Lower chest: No acute findings. Hepatobiliary: There is mild diffuse hepatic steatosis. No suspicious liver abnormality identified. Multiple stones are identified within the gallbladder measuring up to 1.1 cm. Diffuse gallbladder wall thickening is identified measuring up to 7 mm in thickness, image 28/9. Mild pericholecystic fluid is identified. The common bile duct measures 5 mm in thickness, image 13/11. Pancreas:  No obstructing stone or mass identified. Spleen:  Within normal limits in size and appearance. Adrenals/Urinary Tract: Normal appearance of the adrenal glands. No kidney mass identified. 6 mm T2 hyperintense lesion arising from posterior cortex of the right mid kidney is too small to characterize but likely represents a small cyst.  Stomach/Bowel: Visualized portions within the abdomen are unremarkable. Vascular/Lymphatic: No pathologically enlarged lymph nodes identified. No abdominal aortic aneurysm demonstrated. Other:  No free fluid or fluid collections.  Musculoskeletal: No suspicious bone lesions identified. IMPRESSION: 1. Gallstones and gallbladder wall thickening concerning for acute cholecystitis. 2. Mild increase caliber of the common bile duct measuring 5 mm. No obstructing stone or mass identified. 3. Hepatic steatosis. Electronically Signed   By: Signa Kell M.D.   On: 10/03/2019 14:56       Subjective: Feeling better.  Tolerating solid diet.  No nausea or vomiting.  No recurrence abdominal pain.  Discharge Exam: Vitals:   10/04/19 0019 10/04/19 0410 10/04/19 0800 10/04/19 1326  BP: (!) 116/59 119/60 (!) 125/92 (!) 124/52  Pulse: 62 65 78 63  Resp: 16 16 18 17   Temp: 98.8 F (37.1 C) 99 F (37.2 C)  98.2 F (36.8 C)  TempSrc:      SpO2: 98% 96% 98% 99%  Weight:      Height:        General: Pt is alert, awake, not in acute distress Cardiovascular: RRR, S1/S2 +, no rubs, no gallops Respiratory: CTA bilaterally, no wheezing, no rhonchi Abdominal: Soft, NT, ND, bowel sounds + Extremities: no edema, no cyanosis    The results of significant diagnostics from this hospitalization (including imaging, microbiology, ancillary and laboratory) are listed below for reference.     Microbiology: Recent Results (from the past 240 hour(s))  Respiratory Panel by RT PCR (Flu A&B, Covid) - Nasopharyngeal Swab     Status: None   Collection Time: 10/03/19 11:51 AM   Specimen: Nasopharyngeal Swab  Result Value Ref Range Status   SARS Coronavirus 2 by RT PCR NEGATIVE NEGATIVE Final    Comment: (NOTE) SARS-CoV-2 target nucleic acids are NOT DETECTED. The SARS-CoV-2 RNA is generally detectable in upper respiratoy specimens during the acute phase of infection. The lowest concentration of SARS-CoV-2 viral copies this assay can detect is 131 copies/mL. A negative result does not preclude SARS-Cov-2 infection and should not be used as the sole basis for treatment or other patient management decisions. A negative result may occur with   improper specimen collection/handling, submission of specimen other than nasopharyngeal swab, presence of viral mutation(s) within the areas targeted by this assay, and inadequate number of viral copies (<131 copies/mL). A negative result must be combined with clinical observations, patient history, and epidemiological information. The expected result is Negative. Fact Sheet for Patients:  10/05/19 Fact Sheet for Healthcare Providers:  https://www.moore.com/ This test is not yet ap proved or cleared by the https://www.young.biz/ FDA and  has been authorized for detection and/or diagnosis of SARS-CoV-2 by FDA under an Emergency Use Authorization (EUA). This EUA will remain  in effect (meaning this test can be used) for the duration of the COVID-19 declaration under Section 564(b)(1) of the Act, 21 U.S.C. section 360bbb-3(b)(1), unless the authorization is terminated or revoked sooner.    Influenza A by PCR NEGATIVE NEGATIVE Final   Influenza B by PCR NEGATIVE NEGATIVE Final    Comment: (NOTE) The Xpert Xpress SARS-CoV-2/FLU/RSV assay is intended as an aid in  the diagnosis of influenza from Nasopharyngeal swab specimens and  should not be used as a sole basis for treatment. Nasal washings and  aspirates are unacceptable for Xpert Xpress SARS-CoV-2/FLU/RSV  testing. Fact Sheet for Patients: [place] Fact Sheet for Healthcare Providers: https://www.moore.com/ This test is not yet approved or cleared by the https://www.young.biz/ FDA and  has  been authorized for detection and/or diagnosis of SARS-CoV-2 by  FDA under an Emergency Use Authorization (EUA). This EUA will remain  in effect (meaning this test can be used) for the duration of the  Covid-19 declaration under Section 564(b)(1) of the Act, 21  U.S.C. section 360bbb-3(b)(1), unless the authorization is  terminated or revoked. Performed at  [facility], [address], [place], [place] [address]      Labs: BNP (last 3 results) No results for input(s): BNP in the last 8760 hours. Basic Metabolic Panel: Recent Labs  Lab 10/03/19 0943 10/04/19 0552  NA 139 139  K 3.0* 3.4*  CL 101 107  CO2 26 23  GLUCOSE 120* 101*  BUN 17 12  CREATININE 0.62 0.60  CALCIUM 9.8 9.0  MG  --  2.2   Liver Function Tests: Recent Labs  Lab 10/03/19 0943 10/04/19 0552  AST 20 18  ALT 25 20  ALKPHOS 76 61  BILITOT 0.3 0.7  PROT 7.7 6.4*  ALBUMIN 4.2 3.7   Recent Labs  Lab 10/03/19 0943  LIPASE 29   No results for input(s): AMMONIA in the last 168 hours. CBC: Recent Labs  Lab 10/03/19 0943 10/04/19 0552  WBC 7.7 6.9  NEUTROABS 5.9  --   HGB 14.6 13.0  HCT 42.3 38.9  MCV 90.6 93.3  PLT 319 296   Cardiac Enzymes: No results for input(s): CKTOTAL, CKMB, CKMBINDEX, TROPONINI in the last 168 hours. BNP: Invalid input(s): POCBNP CBG: Recent Labs  Lab 10/03/19 1002  GLUCAP 114*   D-Dimer No results for input(s): DDIMER in the last 72 hours. Hgb A1c No results for input(s): HGBA1C in the last 72 hours. Lipid Profile No results for input(s): CHOL, HDL, LDLCALC, TRIG, CHOLHDL, LDLDIRECT in the last 72 hours. Thyroid function studies No results for input(s): TSH, T4TOTAL, T3FREE, THYROIDAB in the last 72 hours.  Invalid input(s): FREET3 Anemia work up No results for input(s): VITAMINB12, FOLATE, FERRITIN, TIBC, IRON, RETICCTPCT in the last 72 hours. Urinalysis    Component Value Date/Time   COLORURINE STRAW (A) 10/03/2019 1046   APPEARANCEUR CLEAR 10/03/2019 1046   LABSPEC 1.005 10/03/2019 1046   PHURINE 8.0 10/03/2019 1046   GLUCOSEU NEGATIVE 10/03/2019 1046   HGBUR SMALL (A) 10/03/2019 1046   BILIRUBINUR NEGATIVE 10/03/2019 1046   KETONESUR 5 (A) 10/03/2019 1046   PROTEINUR NEGATIVE 10/03/2019 1046   NITRITE NEGATIVE 10/03/2019 1046   LEUKOCYTESUR NEGATIVE 10/03/2019 1046   Sepsis Labs Invalid  input(s): PROCALCITONIN,  WBC,  LACTICIDVEN Microbiology Recent Results (from the past 240 hour(s))  Respiratory Panel by RT PCR (Flu A&B, Covid) - Nasopharyngeal Swab     Status: None   Collection Time: 10/03/19 11:51 AM   Specimen: Nasopharyngeal Swab  Result Value Ref Range Status   SARS Coronavirus 2 by RT PCR NEGATIVE NEGATIVE Final    Comment: (NOTE) SARS-CoV-2 target nucleic acids are NOT DETECTED. The SARS-CoV-2 RNA is generally detectable in upper respiratoy specimens during the acute phase of infection. The lowest concentration of SARS-CoV-2 viral copies this assay can detect is 131 copies/mL. A negative result does not preclude SARS-Cov-2 infection and should not be used as the sole basis for treatment or other patient management decisions. A negative result may occur with  improper specimen collection/handling, submission of specimen other than nasopharyngeal swab, presence of viral mutation(s) within the areas targeted by this assay, and inadequate number of viral copies (<131 copies/mL). A negative result must be combined with clinical observations, patient history,  and epidemiological information. The expected result is Negative. Fact Sheet for Patients:  https://www.moore.com/https://www.fda.gov/media/142436/download Fact Sheet for Healthcare Providers:  https://www.young.biz/https://www.fda.gov/media/142435/download This test is not yet ap proved or cleared by the [place] FDA and  has been authorized for detection and/or diagnosis of SARS-CoV-2 by FDA under an Emergency Use Authorization (EUA). This EUA will remain  in effect (meaning this test can be used) for the duration of the COVID-19 declaration under Section 564(b)(1) of the Act, 21 U.S.C. section 360bbb-3(b)(1), unless the authorization is terminated or revoked sooner.    Influenza A by PCR NEGATIVE NEGATIVE Final   Influenza B by PCR NEGATIVE NEGATIVE Final    Comment: (NOTE) The Xpert Xpress SARS-CoV-2/FLU/RSV assay is intended as an aid in   the diagnosis of influenza from Nasopharyngeal swab specimens and  should not be used as a sole basis for treatment. Nasal washings and  aspirates are unacceptable for Xpert Xpress SARS-CoV-2/FLU/RSV  testing. Fact Sheet for Patients: https://www.moore.com/https://www.fda.gov/media/142436/download Fact Sheet for Healthcare Providers: https://www.young.biz/https://www.fda.gov/media/142435/download This test is not yet approved or cleared by the [place] FDA and  has been authorized for detection and/or diagnosis of SARS-CoV-2 by  FDA under an Emergency Use Authorization (EUA). This EUA will remain  in effect (meaning this test can be used) for the duration of the  Covid-19 declaration under Section 564(b)(1) of the Act, 21  U.S.C. section 360bbb-3(b)(1), unless the authorization is  terminated or revoked. Performed at [facility], [address] [place], [place] [address]      Time coordinating discharge: 35mins  SIGNED:   Erick BlinksJehanzeb Kristine Tiley, MD  [facility] 10/04/2019, 9:37 PM   If 7PM-7AM, please contact night-coverage [email or web address]

## 2019-10-04 NOTE — Progress Notes (Signed)
Discharge instructions, medications, and follow up appointments reviewed with patient and family at the bedside. Patient had no questions at this time.

## 2019-10-04 NOTE — Discharge Instructions (Signed)
Biliary Colic, Adult  Biliary colic is severe pain caused by a problem with a small organ in the upper right part of your belly (gallbladder). The gallbladder stores a digestive fluid produced in the liver (bile) that helps the body break down fat. Bile and other digestive enzymes are carried from the liver to the small intestine through tube-like structures (bile ducts). The gallbladder and the bile ducts form the biliary tract. Sometimes hard deposits of digestive fluids form in the gallbladder (gallstones) and block the flow of bile from the gallbladder, causing biliary colic. This condition is also called a gallbladder attack. Gallstones can be as small as a grain of sand or as big as a golf ball. There could be just one gallstone in the gallbladder, or there could be many. What are the causes? Biliary colic is usually caused by gallstones. Less often, a tumor could block the flow of bile from the gallbladder and trigger biliary colic. What increases the risk? This condition is more likely to develop in:  Women.  People of Hispanic descent.  People with a family history of gallstones.  People who are obese.  People who suddenly or quickly lose weight.  People who eat a high-calorie, low-fiber diet that is rich in refined carbs (carbohydrates), such as white bread and white rice.  People who have an intestinal disease that affects nutrient absorption, such as Crohn disease.  People who have a metabolic condition, such as metabolic syndrome or diabetes. What are the signs or symptoms? Severe pain in the upper right side of the belly is the main symptom of biliary colic. You may feel this pain below the chest but above the hip. This pain often occurs at night or after eating a very fatty meal. This pain may get worse for up to an hour and last as long as 12 hours. In most cases, the pain fades (subsides) within a couple hours. Other symptoms of this condition include:  Nausea and  vomiting.  Pain under the right shoulder. How is this diagnosed? This condition is diagnosed based on your medical history, your symptoms, and a physical exam. You may have tests, including:  Blood tests to rule out infection or inflammation of the bile ducts, gallbladder, pancreas, or liver.  Imaging studies such as: ? Ultrasound. ? CT scan. ? MRI. In some cases, you may need to have an imaging study done using a small amount of radioactive material (nuclear medicine) to confirm the diagnosis. How is this treated? Treatment for this condition may include medicine to relieve your pain or nausea. If you have gallstones that are causing biliary colic, you may need surgery to remove the gallbladder (cholecystectomy). Gallstones can also be dissolved gradually with medicine. It may take months or years before the gallstones are completely gone. Follow these instructions at home:  Take over-the-counter and prescription medicines only as told by your health care provider.  Drink enough fluid to keep your urine clear or pale yellow.  Follow instructions from your health care provider about eating or drinking restrictions. These may include avoiding: ? Fatty, greasy, and fried foods. ? Any foods that make the pain worse. ? Overeating. ? Having a large meal after not eating for a while.  Keep all follow-up visits as told by your health care provider. This is important. How is this prevented? Steps to prevent this condition include:  Maintaining a healthy body weight.  Getting regular exercise.  Eating a healthy, high-fiber, low-fat diet.  Limiting how much   sugar and refined carbs you eat, such as sweets, white flour, and white rice. Contact a health care provider if:  Your pain lasts more than 5 hours.  You vomit.  You have a fever and chills.  Your pain gets worse. Get help right away if:  Your skin or the whites of your eyes look yellow (jaundice).  Your have tea-colored  urine and light-colored stools.  You are dizzy or you faint. Summary  Biliary colic is severe pain caused by a problem with a small organ in the upper right part of your belly (gallbladder).  Treatments for this condition include medicines that relieves your pain or nausea and medicines that slowly dissolves the gallstones.  If gallstones cause your biliary colic, the treatment is surgery to remove the gallbladder (cholecystectomy). This information is not intended to replace advice given to you by your health care provider. Make sure you discuss any questions you have with your health care provider. Document Revised: 05/06/2017 Document Reviewed: 12/08/2015 Elsevier Patient Education  2020 [employer]vier Inc.  

## 2019-10-04 NOTE — Care Management Obs Status (Signed)
MEDICARE OBSERVATION STATUS NOTIFICATION   Patient Details  Name: Felicia Whitehead MRN: [id number] Date of Birth: May 07, 1936   Medicare Observation Status Notification Given:  Yes    Coner Gibbard, Chrystine Oiler, RN 10/04/2019, 3:00 PM

## 2019-10-04 NOTE — Progress Notes (Signed)
Subjective:  No abdominal pain. No N/V.   Objective: Vital signs in last 24 hours: Temp:  [98.1 F (36.7 C)-99 F (37.2 C)] 99 F (37.2 C) (04/29 0410) Pulse Rate:  [62-83] 65 (04/29 0410) Resp:  [12-20] 16 (04/29 0410) BP: (91-146)/(51-88) 119/60 (04/29 0410) SpO2:  [94 %-100 %] 96 % (04/29 0410) Weight:  [62 kg] 62 kg (04/28 0830)   General:   Alert,  Well-developed, well-nourished, pleasant and cooperative in NAD Head:  Normocephalic and atraumatic. Eyes:  Sclera clear, no icterus.  Abdomen:  Soft, nontender and nondistended.   Extremities:  Without clubbing, deformity or edema. Neurologic:  Alert and  oriented x4;  grossly normal neurologically. Psych:  Alert and cooperative. Normal mood and affect.  Intake/Output from previous day: 04/28 0701 - 04/29 0700 In: 1975.5 [P.O.:480; I.V.:495.5; IV Piggyback:1000] Out: -  Intake/Output this shift: No intake/output data recorded.  Lab Results: CBC Recent Labs    10/03/19 0943 10/04/19 0552  WBC 7.7 6.9  HGB 14.6 13.0  HCT 42.3 38.9  MCV 90.6 93.3  PLT 319 296   BMET Recent Labs    10/03/19 0943 10/04/19 0552  NA 139 139  K 3.0* 3.4*  CL 101 107  CO2 26 23  GLUCOSE 120* 101*  BUN 17 12  CREATININE 0.62 0.60  CALCIUM 9.8 9.0   LFTs Recent Labs    10/03/19 0943 10/04/19 0552  BILITOT 0.3 0.7  BILIDIR <0.1  --   IBILI NOT CALCULATED  --   ALKPHOS 76 61  AST 20 18  ALT 25 20  PROT 7.7 6.4*  ALBUMIN 4.2 3.7   Recent Labs    10/03/19 0943  LIPASE 29   PT/INR No results for input(s): LABPROT, INR in the last 72 hours.    Imaging Studies: MR 3D Recon At Scanner  Result Date: 10/03/2019 CLINICAL DATA:  Gallstones. Evaluate for choledocholithiasis EXAM: MRI ABDOMEN WITHOUT AND WITH CONTRAST (INCLUDING MRCP) TECHNIQUE: Multiplanar multisequence MR imaging of the abdomen was performed both before and after the administration of intravenous contrast. Heavily T2-weighted images of the biliary and  pancreatic ducts were obtained, and three-dimensional MRCP images were rendered by post processing. CONTRAST:  16mL GADAVIST GADOBUTROL 1 MMOL/ML IV SOLN COMPARISON:  None. FINDINGS: Lower chest: No acute findings. Hepatobiliary: There is mild diffuse hepatic steatosis. No suspicious liver abnormality identified. Multiple stones are identified within the gallbladder measuring up to 1.1 cm. Diffuse gallbladder wall thickening is identified measuring up to 7 mm in thickness, image 28/9. Mild pericholecystic fluid is identified. The common bile duct measures 5 mm in thickness, image 13/11. Pancreas:  No obstructing stone or mass identified. Spleen:  Within normal limits in size and appearance. Adrenals/Urinary Tract: Normal appearance of the adrenal glands. No kidney mass identified. 6 mm T2 hyperintense lesion arising from posterior cortex of the right mid kidney is too small to characterize but likely represents a small cyst. Stomach/Bowel: Visualized portions within the abdomen are unremarkable. Vascular/Lymphatic: No pathologically enlarged lymph nodes identified. No abdominal aortic aneurysm demonstrated. Other:  No free fluid or fluid collections. Musculoskeletal: No suspicious bone lesions identified. IMPRESSION: 1. Gallstones and gallbladder wall thickening concerning for acute cholecystitis. 2. Mild increase caliber of the common bile duct measuring 5 mm. No obstructing stone or mass identified. 3. Hepatic steatosis. Electronically Signed   By: Signa Kell M.D.   On: 10/03/2019 14:56   US Abdomen Limited  Result Date: 10/03/2019 CLINICAL DATA:  Mid epigastric pain and nausea for  3 months EXAM: ULTRASOUND ABDOMEN LIMITED RIGHT UPPER QUADRANT COMPARISON:  MRA of the abdomen July 07, 2007 FINDINGS: Gallbladder: Cholelithiasis with gallbladder wall thickening. No reported tenderness over the gallbladder. Largest calculus in the gallbladder at 1.5 cm with multiple layering calculi. Common bile duct:  Diameter: 8.3 mm Liver: Generalized increased echogenicity and coarsened hepatic echotexture, suggestion of nodular surface contour. Portal vein is patent on color Doppler imaging with normal direction of blood flow towards the liver. Other: No ascites. IMPRESSION: 1. Cholelithiasis and gallbladder wall thickening without reported tenderness over the gallbladder. Findings are equivocal for cholecystitis but suspicious particularly given biliary ductal distension. 2. MRI/MRCP could be performed to exclude ductal stone. HIDA scan could also potentially be of benefit in this patient to exclude the possibility of cholecystitis and ductal obstruction. 3. Hepatic steatosis with question of nodular liver contour. Underlying liver disease is considered, correlate clinically. Electronically Signed   By: Donzetta Kohut M.D.   On: 10/03/2019 10:36   MR ABDOMEN MRCP W WO CONTAST  Result Date: 10/03/2019 CLINICAL DATA:  Gallstones. Evaluate for choledocholithiasis EXAM: MRI ABDOMEN WITHOUT AND WITH CONTRAST (INCLUDING MRCP) TECHNIQUE: Multiplanar multisequence MR imaging of the abdomen was performed both before and after the administration of intravenous contrast. Heavily T2-weighted images of the biliary and pancreatic ducts were obtained, and three-dimensional MRCP images were rendered by post processing. CONTRAST:  46mL GADAVIST GADOBUTROL 1 MMOL/ML IV SOLN COMPARISON:  None. FINDINGS: Lower chest: No acute findings. Hepatobiliary: There is mild diffuse hepatic steatosis. No suspicious liver abnormality identified. Multiple stones are identified within the gallbladder measuring up to 1.1 cm. Diffuse gallbladder wall thickening is identified measuring up to 7 mm in thickness, image 28/9. Mild pericholecystic fluid is identified. The common bile duct measures 5 mm in thickness, image 13/11. Pancreas:  No obstructing stone or mass identified. Spleen:  Within normal limits in size and appearance. Adrenals/Urinary Tract: Normal  appearance of the adrenal glands. No kidney mass identified. 6 mm T2 hyperintense lesion arising from posterior cortex of the right mid kidney is too small to characterize but likely represents a small cyst. Stomach/Bowel: Visualized portions within the abdomen are unremarkable. Vascular/Lymphatic: No pathologically enlarged lymph nodes identified. No abdominal aortic aneurysm demonstrated. Other:  No free fluid or fluid collections. Musculoskeletal: No suspicious bone lesions identified. IMPRESSION: 1. Gallstones and gallbladder wall thickening concerning for acute cholecystitis. 2. Mild increase caliber of the common bile duct measuring 5 mm. No obstructing stone or mass identified. 3. Hepatic steatosis. Electronically Signed   By: Signa Kell M.D.   On: 10/03/2019 14:56  [2 weeks]   Assessment:  Pleasant 84 year old female presenting with weakness, shakiness, near syncope in the setting of poor oral intake.  Previous complaints of epigastric pain with a ED visit 2 weeks ago after eating a hotdog. Limited evaluation with labs/EKG. No imaging at that time. Started on pantoprazole 40mg  daily. Seen in follow up by PCP and had positive H.pylori breath test and prescribed Amoxicillin, Biaxin, and Prevacid. She did not take Prevacid or pantoprazole but continued famotidine 40mg  BID which she was already on. Reports she was directed to do so by another provider at PCP office. She reports her abdominal pain resolved over a week ago but has had some vague nausea/poor appetite since being on antibiotics. She completed about 7 days of treatment.   Right upper quadrant ultrasound found cholelithiasis and likely cholecystitis with mildly dilated common bile duct. No tenderness noted over the gallbladder.  LFTs, lipase normal.  MRI/MRCP demonstrated again cholelithiasis, with gallbladder wall thickening concerning for with acute cholecystitis.  Essentially normal common bile duct diameter 5 mm. No CBD stones noted.    Suspect recent epigastric pain related to gallbladder/cholecystitis as presentation atypical for H.pylori gastritis or ulcers. She has completed about 7 days of antibiotics for H.pylori but unfortunately did not take PPI BID along with it. Discussed with Dr. Roderic Palau, as patient did not tolerate antibiotics well, would hold for now. Consider repeat H.pylori breath test as outpatient once able to be off antibiotics and PPI or bismuth products for at least two weeks.   Plan: 1. Await surgical consultation. 2. Recommend stopping H. pylori treatment for now.  Consider H. pylori breath test in the future after being off of PPI, bismuth, and antibiotics for at least 2 weeks to determine if persistent infection.  Laureen Ochs. Bernarda Caffey [facility] [phone] 4/29/202110:08 AM     LOS: 1 day

## 2019-10-04 NOTE — Care Management CC44 (Signed)
Condition Code 44 Documentation Completed  Patient Details  Name: Felicia Whitehead MRN: [id number] Date of Birth: [birth year]   Condition Code 44 given:  Yes Patient signature on Condition Code 44 notice:  Yes Documentation of 2 MD's agreement:  Yes Code 44 added to claim:  Yes    Burtis Imhoff, Chrystine Oiler, RN 10/04/2019, 3:00 PM

## 2019-10-08 DIAGNOSIS — E860 Dehydration: Secondary | ICD-10-CM | POA: Diagnosis not present

## 2019-10-08 DIAGNOSIS — R55 Syncope and collapse: Secondary | ICD-10-CM | POA: Diagnosis not present

## 2019-10-08 DIAGNOSIS — I10 Essential (primary) hypertension: Secondary | ICD-10-CM | POA: Diagnosis not present

## 2019-10-08 DIAGNOSIS — Z6823 Body mass index (BMI) 23.0-23.9, adult: Secondary | ICD-10-CM | POA: Diagnosis not present

## 2019-10-08 DIAGNOSIS — K810 Acute cholecystitis: Secondary | ICD-10-CM | POA: Diagnosis not present

## 2019-10-22 ENCOUNTER — Telehealth: Payer: Self-pay | Admitting: Gastroenterology

## 2019-10-22 NOTE — Telephone Encounter (Signed)
Lmom, waiting on a return call.  

## 2019-10-22 NOTE — Telephone Encounter (Signed)
Patient was seen in hospital last month. She had been started on H.pylori meds for positive breath test under direction of PCP but completed only 7 days of tx.   Would advise repeat H.pylori breath test off antibiotics and PPI (pantoprazole and lansoprazole) for 2 weeks. She should complete it in June. Please make arrangements.

## 2019-10-22 NOTE — Telephone Encounter (Signed)
Pt returned phone call. I Verified name and dob Notified pt of providers recommendations  Pt stated she has already seen her pcp since she has been discharged from the hospital and does not want to f/u with our office for her care. Pt disconnected the call

## 2019-10-22 NOTE — Telephone Encounter (Signed)
noted 

## 2019-10-26 DIAGNOSIS — D7289 Other specified disorders of white blood cells: Secondary | ICD-10-CM | POA: Diagnosis not present

## 2020-04-03 DIAGNOSIS — Z23 Encounter for immunization: Secondary | ICD-10-CM | POA: Diagnosis not present

## 2020-04-03 DIAGNOSIS — Z0000 Encounter for general adult medical examination without abnormal findings: Secondary | ICD-10-CM | POA: Diagnosis not present

## 2020-04-03 DIAGNOSIS — Z1331 Encounter for screening for depression: Secondary | ICD-10-CM | POA: Diagnosis not present

## 2020-04-03 DIAGNOSIS — Z6823 Body mass index (BMI) 23.0-23.9, adult: Secondary | ICD-10-CM | POA: Diagnosis not present

## 2020-04-03 DIAGNOSIS — G4700 Insomnia, unspecified: Secondary | ICD-10-CM | POA: Diagnosis not present

## 2020-05-05 DIAGNOSIS — Z0000 Encounter for general adult medical examination without abnormal findings: Secondary | ICD-10-CM | POA: Diagnosis not present

## 2020-05-05 DIAGNOSIS — Z1389 Encounter for screening for other disorder: Secondary | ICD-10-CM | POA: Diagnosis not present

## 2020-05-05 DIAGNOSIS — Z6823 Body mass index (BMI) 23.0-23.9, adult: Secondary | ICD-10-CM | POA: Diagnosis not present

## 2020-05-05 DIAGNOSIS — E7849 Other hyperlipidemia: Secondary | ICD-10-CM | POA: Diagnosis not present

## 2020-05-05 DIAGNOSIS — Z23 Encounter for immunization: Secondary | ICD-10-CM | POA: Diagnosis not present

## 2020-05-11 IMAGING — MR MR ABDOMEN WO/W CM MRCP
8 of 20 series · 14 of 48 positions shown · IV contrast (gadavist)
Comparison: None.

CLINICAL DATA: Gallstones. Evaluate for choledocholithiasis

EXAM:
MRI ABDOMEN WITHOUT AND WITH CONTRAST (INCLUDING MRCP)
TECHNIQUE: Multiplanar multisequence MR imaging of the abdomen was performed
both before and after the administration of intravenous contrast.
Heavily T2-weighted images of the biliary and pancreatic ducts were
obtained, and three-dimensional MRCP images were rendered by post
processing.
CONTRAST:  7mL GADAVIST GADOBUTROL 1 MMOL/ML IV SOLN

[Series 3: GRE · coronal · 5.0mm · 0.98mm/px · 1 of 32 slices shown]
[im 1/32]
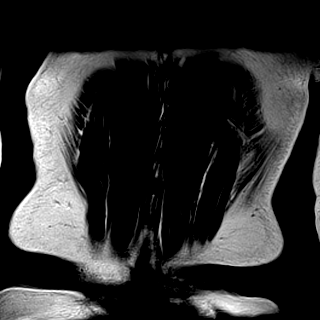

[Series 4: ax dual echo_in · axial · 4.0mm · 0.51mm/px · z∈[-52,+152]mm · 2 of 52 slices shown]
[im 1/52]
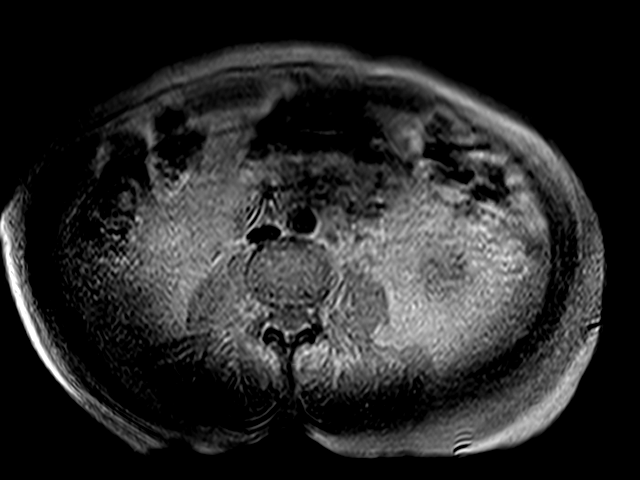
[im 52/52]
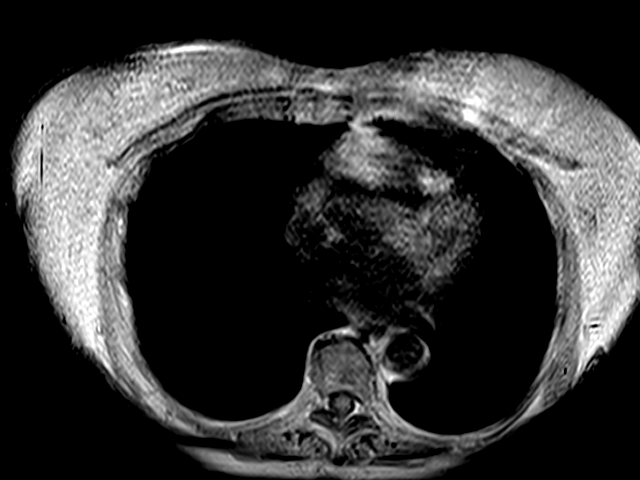

[Series 5: ax dual echo_opp · axial · 4.0mm · 0.51mm/px · z∈[-52,+152]mm · 2 of 52 slices shown]
[im 1/52]
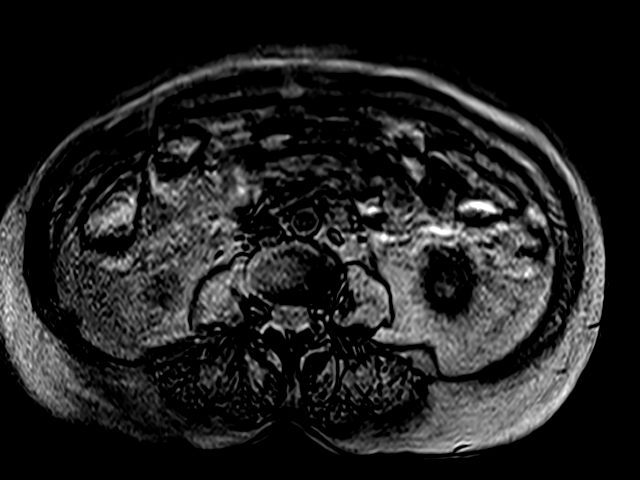
[im 52/52]
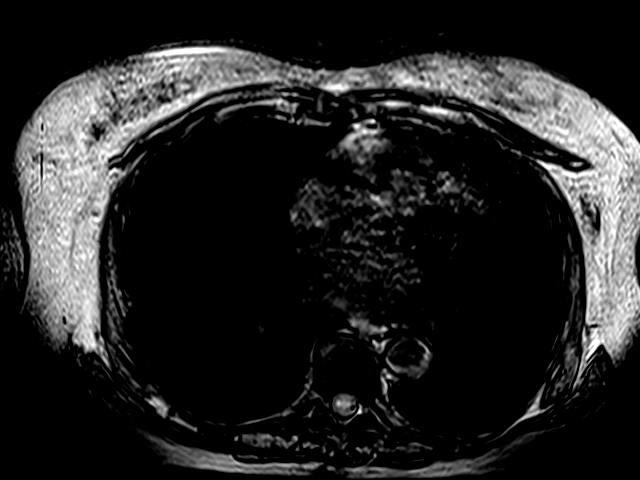

[Series 9: T2 · axial · 4.0mm · 1.01mm/px · 1 of 45 slices shown (1 of 2)]
[im 1/45]
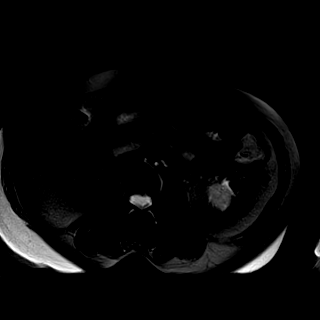

[Series 11: cor thins · coronal · 4.0mm · 0.78mm/px · 1 of 19 slices shown]
[im 1/19]
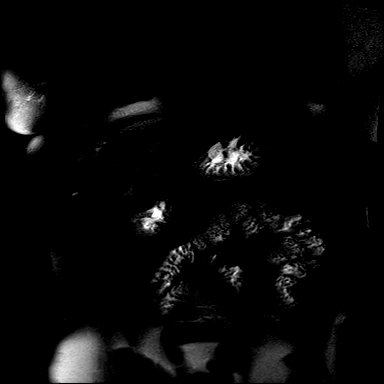

[Series 14: DWI · axial · 5.0mm · 0.89mm/px · z∈[-50,+160]mm · 3 of 72 slices shown]
[im 1/72]
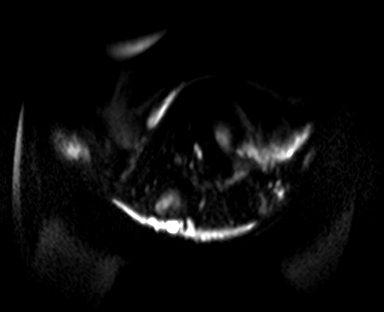
[im 36/72]
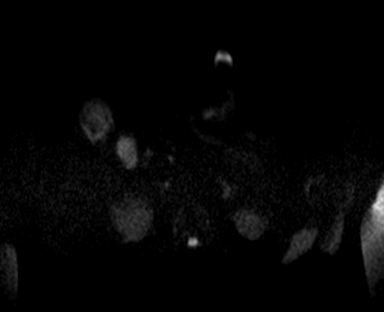
[im 72/72]
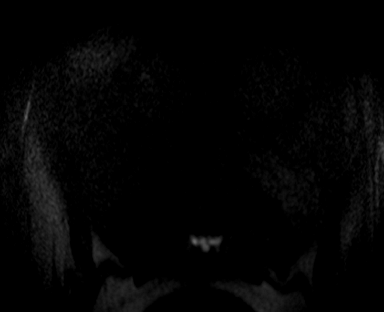

[Series 15: ax dwi_adc · axial · 5.0mm · 0.89mm/px · z∈[-50,+160]mm · 2 of 36 slices shown]
[im 1/36]
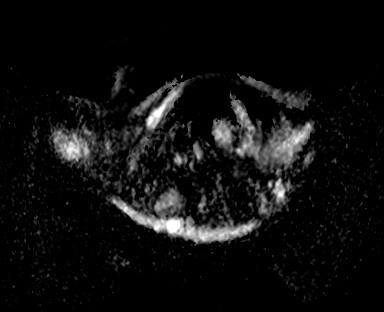
[im 36/36]
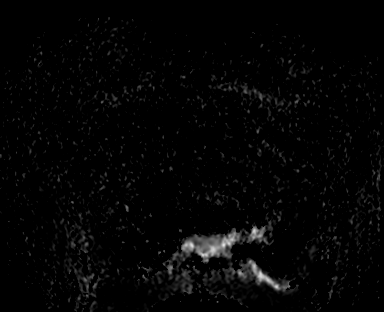

[Series 24: T2 · axial · 5.0mm · 1.27mm/px · z∈[-67,+167]mm · 2 of 40 slices shown (2 of 2)]
[im 1/40]
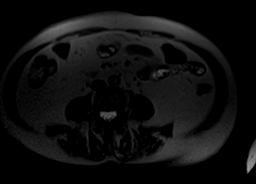
[im 40/40]
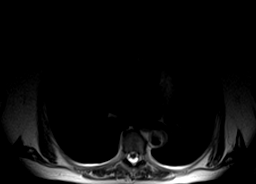

[14 of 48 positions shown; findings below may reference images not displayed]

FINDINGS: Lower chest: No acute findings.

Hepatobiliary: There is mild diffuse hepatic steatosis. No
suspicious liver abnormality identified. Multiple stones are
identified within the gallbladder measuring up to 1.1 cm. Diffuse
gallbladder wall thickening is identified measuring up to 7 mm in
thickness, image [DATE]. Mild pericholecystic fluid is identified.

Pancreas:  No obstructing stone or mass identified.

Spleen:  Within normal limits in size and appearance.

Adrenals/Urinary Tract: Normal appearance of the adrenal glands. No
kidney mass identified. 6 mm T2 hyperintense lesion arising from
posterior cortex of the right mid kidney is too small to
characterize but likely represents a small cyst.

Stomach/Bowel: Visualized portions within the abdomen are
unremarkable.

Vascular/Lymphatic: No pathologically enlarged lymph nodes
identified. No abdominal aortic aneurysm demonstrated.

Other:  No free fluid or fluid collections.

Musculoskeletal: No suspicious bone lesions identified.
IMPRESSION: 1. Gallstones and gallbladder wall thickening concerning for acute
cholecystitis.
2. Mild increase caliber of the common bile duct measuring 5 mm. No
obstructing stone or mass identified.
3. Hepatic steatosis.

## 2020-05-12 DIAGNOSIS — Z1231 Encounter for screening mammogram for malignant neoplasm of breast: Secondary | ICD-10-CM | POA: Diagnosis not present

## 2020-07-21 DIAGNOSIS — H04123 Dry eye syndrome of bilateral lacrimal glands: Secondary | ICD-10-CM | POA: Diagnosis not present

## 2020-07-29 ENCOUNTER — Ambulatory Visit (INDEPENDENT_AMBULATORY_CARE_PROVIDER_SITE_OTHER): Payer: Medicare HMO

## 2020-07-29 ENCOUNTER — Ambulatory Visit
Admission: EM | Admit: 2020-07-29 | Discharge: 2020-07-29 | Disposition: A | Discharge status: Home / Self Care | Payer: Medicare HMO | Attending: Emergency Medicine | Admitting: Emergency Medicine

## 2020-07-29 ENCOUNTER — Encounter: Payer: Self-pay | Admitting: Emergency Medicine

## 2020-07-29 ENCOUNTER — Other Ambulatory Visit: Payer: Self-pay

## 2020-07-29 DIAGNOSIS — M25461 Effusion, right knee: Secondary | ICD-10-CM

## 2020-07-29 DIAGNOSIS — M7989 Other specified soft tissue disorders: Secondary | ICD-10-CM | POA: Diagnosis not present

## 2020-07-29 DIAGNOSIS — M25561 Pain in right knee: Secondary | ICD-10-CM

## 2020-07-29 DIAGNOSIS — M1711 Unilateral primary osteoarthritis, right knee: Secondary | ICD-10-CM

## 2020-07-29 MED ORDER — NAPROXEN 500 MG PO TABS
500.0000 mg | ORAL_TABLET | Freq: Two times a day (BID) | ORAL | 0 refills | Status: AC
Start: 2020-07-29 — End: ?

## 2020-07-29 NOTE — ED Provider Notes (Addendum)
[facility]   [id number] 07/29/20 Arrival Time: 1202   Chief Complaint  Patient presents with  . Knee Pain    SUBJECTIVE: History from: patient.  [place] L Musleh is a 85 y.o. female presented to the urgent care for complaint of right knee pain and swelling for the past 2 weeks. Denies any precipitating event, trauma or injury.  Localized pain to the right knee.  She described the pain as constant and achy.  She has tried OTC medications without relief.  Her symptoms are made worse with ROM.  She denies similar symptoms in the past.  Denies chills, fever, nausea, vomiting, diarrhea    ROS: As per HPI.  All other pertinent ROS negative.      Past Medical History:  Diagnosis Date  . Hypertension    Past Surgical History:  Procedure Laterality Date  . ABDOMINAL HYSTERECTOMY     Allergies  Allergen Reactions  . Claritin-D 24 Hour [Loratadine-Pseudoephedrine Er]    No current facility-administered medications on file prior to encounter.   Current Outpatient Medications on File Prior to Encounter  Medication Sig Dispense Refill  . amLODipine (NORVASC) 10 MG tablet     . benazepril (LOTENSIN) 5 MG tablet Take 5 mg by mouth daily.    . lansoprazole (PREVACID) 30 MG capsule Take 30 mg by mouth 2 (two) times daily.     Social History   Socioeconomic History  . Marital status: Widowed    Spouse name: Not on file  . Number of children: Not on file  . Years of education: Not on file  . Highest education level: Not on file  Occupational History  . Not on file  Tobacco Use  . Smoking status: Never Smoker  . Smokeless tobacco: Never Used  Substance and Sexual Activity  . Alcohol use: Never  . Drug use: Never  . Sexual activity: Not on file  Other Topics Concern  . Not on file  Social History Narrative  . Not on file   Social Determinants of Health   Financial Resource Strain: Not on file  Food Insecurity: Not on file  Transportation Needs: Not on file   Physical Activity: Not on file  Stress: Not on file  Social Connections: Not on file  Intimate Partner Violence: Not on file   No family history on file.  OBJECTIVE:  Vitals:   07/29/20 1215  BP: (!) 160/72  Pulse: 68  Resp: 18  Temp: 97.9 F (36.6 C)  TempSrc: Oral  SpO2: 96%     Physical Exam Vitals and nursing note reviewed.  Constitutional:      General: She is not in acute distress.    Appearance: Normal appearance. She is normal weight. She is not ill-appearing, toxic-appearing or diaphoretic.  HENT:     Head: Normocephalic.  Cardiovascular:     Rate and Rhythm: Normal rate and regular rhythm.     Pulses: Normal pulses.     Heart sounds: Normal heart sounds. No murmur heard. No friction rub. No gallop.   Pulmonary:     Effort: Pulmonary effort is normal. No respiratory distress.     Breath sounds: Normal breath sounds. No stridor. No wheezing, rhonchi or rales.  Chest:     Chest wall: No tenderness.  Musculoskeletal:        General: Tenderness present.     Right knee: Swelling present. Tenderness present.     Left knee: Normal.     Comments: The right knee is with  obvious deformity when compared to the left knee.  Swelling and tenderness present.  There is no ecchymosis, open wound, lesion, surface trauma present.  Patient is able to ambulate and bear weight with pain.  Neurovascular status intact.  Neurological:     Mental Status: She is alert and oriented to person, place, and time.     LABS:  No results found for this or any previous visit (from the past 24 hour(s)).   RADIOLOGY  DG Knee Complete 4 Views Right  Result Date: 07/29/2020 CLINICAL DATA:  Swelling and generalized knee pain x2 weeks EXAM: RIGHT KNEE - COMPLETE 4+ VIEW COMPARISON:  None. FINDINGS: Diffuse demineralization of bone. No evidence of fracture, dislocation, or joint effusion. Superior patellar enthesophyte. Mild tricompartment degenerative joint disease. Soft tissues are  unremarkable. IMPRESSION: No acute osseous abnormality. Mild tricompartment degenerative joint disease. Electronically Signed   By: Maudry Mayhew MD   On: 07/29/2020 12:35     Right knee X-ray is negative for bony abnormality including fracture or dislocation.  I have reviewed the x-ray myself and the radiologist interpretation.  I am in agreement with the radiologist interpretation.   ASSESSMENT & PLAN:  1. Pain and swelling of right knee   2. Osteoarthritis of right knee, unspecified osteoarthritis type     Meds ordered this encounter  Medications  . naproxen (NAPROSYN) 500 MG tablet    Sig: Take 1 tablet (500 mg total) by mouth 2 (two) times daily.    Dispense:  30 tablet    Refill:  0    Discharge Instructions  Naproxen was prescribed for pain Follow-up with PCP Follow RICE instruction that is attached Return or go to ED if you develop any new or worsening of your symptoms Reviewed expectations re: course of current medical issues. Questions answered.  Outlined signs and symptoms indicating need for more acute intervention. Patient verbalized understanding. After Visit Summary given.         Durward Parcel, FNP 07/29/20 1246    Durward Parcel, FNP 07/29/20 1247

## 2020-07-29 NOTE — ED Triage Notes (Signed)
Swelling and pain to RT knee x 2 weeks.  Denies any injury.

## 2020-07-29 NOTE — Discharge Instructions (Addendum)
Naproxen was prescribed for pain Follow-up with PCP Follow RICE instruction that is attached Return or go to ED if you develop any new or worsening of your symptoms Reviewed expectations re: course of current medical issues. Questions answered.

## 2021-05-14 DIAGNOSIS — R7309 Other abnormal glucose: Secondary | ICD-10-CM | POA: Diagnosis not present

## 2021-05-14 DIAGNOSIS — I10 Essential (primary) hypertension: Secondary | ICD-10-CM | POA: Diagnosis not present

## 2021-05-14 DIAGNOSIS — Z6823 Body mass index (BMI) 23.0-23.9, adult: Secondary | ICD-10-CM | POA: Diagnosis not present

## 2021-05-14 DIAGNOSIS — Z0001 Encounter for general adult medical examination with abnormal findings: Secondary | ICD-10-CM | POA: Diagnosis not present

## 2021-05-14 DIAGNOSIS — E663 Overweight: Secondary | ICD-10-CM | POA: Diagnosis not present

## 2021-05-14 DIAGNOSIS — Z1331 Encounter for screening for depression: Secondary | ICD-10-CM | POA: Diagnosis not present

## 2021-05-14 DIAGNOSIS — E782 Mixed hyperlipidemia: Secondary | ICD-10-CM | POA: Diagnosis not present

## 2021-05-25 DIAGNOSIS — Z1231 Encounter for screening mammogram for malignant neoplasm of breast: Secondary | ICD-10-CM | POA: Diagnosis not present

## 2022-02-22 DIAGNOSIS — R1032 Left lower quadrant pain: Secondary | ICD-10-CM | POA: Diagnosis not present

## 2022-03-18 DIAGNOSIS — I10 Essential (primary) hypertension: Secondary | ICD-10-CM | POA: Diagnosis not present

## 2022-03-18 DIAGNOSIS — Z23 Encounter for immunization: Secondary | ICD-10-CM | POA: Diagnosis not present

## 2022-03-18 DIAGNOSIS — K582 Mixed irritable bowel syndrome: Secondary | ICD-10-CM | POA: Diagnosis not present

## 2022-03-18 DIAGNOSIS — R7309 Other abnormal glucose: Secondary | ICD-10-CM | POA: Diagnosis not present

## 2022-03-18 DIAGNOSIS — E782 Mixed hyperlipidemia: Secondary | ICD-10-CM | POA: Diagnosis not present

## 2022-03-18 DIAGNOSIS — Z6823 Body mass index (BMI) 23.0-23.9, adult: Secondary | ICD-10-CM | POA: Diagnosis not present

## 2022-05-25 ENCOUNTER — Other Ambulatory Visit: Payer: Self-pay

## 2022-05-25 ENCOUNTER — Encounter (HOSPITAL_COMMUNITY): Payer: Self-pay

## 2022-05-25 DIAGNOSIS — I160 Hypertensive urgency: Secondary | ICD-10-CM | POA: Diagnosis not present

## 2022-05-25 DIAGNOSIS — R42 Dizziness and giddiness: Secondary | ICD-10-CM | POA: Diagnosis not present

## 2022-05-25 DIAGNOSIS — Z79899 Other long term (current) drug therapy: Secondary | ICD-10-CM | POA: Diagnosis not present

## 2022-05-25 DIAGNOSIS — I10 Essential (primary) hypertension: Secondary | ICD-10-CM | POA: Diagnosis not present

## 2022-05-25 NOTE — ED Triage Notes (Signed)
Pt c/o hypertension and dizziness with standing, states she is compliant with medications. BP @ home 191/70.   BP in triage 170/68

## 2022-05-26 ENCOUNTER — Emergency Department (HOSPITAL_COMMUNITY)
Admission: EM | Admit: 2022-05-26 | Discharge: 2022-05-26 | Disposition: A | Discharge status: Home / Self Care | Payer: Medicare HMO | Attending: Emergency Medicine | Admitting: Emergency Medicine

## 2022-05-26 DIAGNOSIS — H81399 Other peripheral vertigo, unspecified ear: Secondary | ICD-10-CM

## 2022-05-26 DIAGNOSIS — I160 Hypertensive urgency: Secondary | ICD-10-CM

## 2022-05-26 LAB — BASIC METABOLIC PANEL
Anion gap: 7 (ref 5–15)
BUN: 18 mg/dL (ref 8–23)
CO2: 26 mmol/L (ref 22–32)
Calcium: 9.4 mg/dL (ref 8.9–10.3)
Chloride: 105 mmol/L (ref 98–111)
Creatinine, Ser: 0.61 mg/dL (ref 0.44–1.00)
GFR, Estimated: >60 mL/min (ref >60)
Glucose, Bld: 164 mg/dL — ABNORMAL HIGH (ref 70–99)
Potassium: 3.2 mmol/L — ABNORMAL LOW (ref 3.5–5.1)
Sodium: 138 mmol/L (ref 135–145)

## 2022-05-26 LAB — CBC
HCT: 42.7 % (ref 36.0–46.0)
Hemoglobin: 14.6 g/dL (ref 12.0–15.0)
MCH: 31.4 pg (ref 26.0–34.0)
MCHC: 34.2 g/dL (ref 30.0–36.0)
MCV: 91.8 fL (ref 80.0–100.0)
Platelets: 258 10*3/uL (ref 150–400)
RBC: 4.65 MIL/uL (ref 3.87–5.11)
RDW: 12.6 % (ref 11.5–15.5)
WBC: 6.8 10*3/uL (ref 4.0–10.5)
nRBC: 0 % (ref 0.0–0.2)

## 2022-05-26 MED ORDER — MECLIZINE HCL 12.5 MG PO TABS
25.0000 mg | ORAL_TABLET | Freq: Once | ORAL | Status: AC
  Administered 2022-05-26: 25 mg via ORAL
  Filled 2022-05-26: qty 2

## 2022-05-26 MED ORDER — MECLIZINE HCL 25 MG PO TABS: 25.0000 mg | ORAL_TABLET | Freq: Three times a day (TID) | ORAL | 0 refills | Status: AC | PRN

## 2022-05-26 NOTE — Discharge Instructions (Signed)
Begin taking meclizine as prescribed as needed for dizziness.  Keep a record of your blood pressures at home and take this with you to your next doctor's appointment to determine whether or not to make changes in your medications.

## 2022-05-26 NOTE — ED Provider Notes (Signed)
[facility] EMERGENCY DEPARTMENT Provider Note   CSN: [id number] Arrival date & time: 05/25/22  2327     History  Chief Complaint  Patient presents with   Hypertension    Felicia Whitehead is a 86 y.o. female.  Patient is an 86 year old female with past medical history of hypertension, GERD.  She presents today for evaluation of elevated blood pressure and dizziness.  She got up from the couch this evening to go to bed when she developed the sudden onset of a spinning sensation.  She had to sit back down.  When symptoms did not improve, she checked her blood pressure and it was found to be 190 systolic.  Patient then presents for evaluation of this.  She denies to me she is having any headache, visual disturbances, weakness, or numbness of the extremities.  Symptoms are worse when she moves and lies flat.  There are no alleviating factors.  The history is provided by the patient.       Home Medications Prior to Admission medications   Medication Sig Start Date End Date Taking? Authorizing Provider  amLODipine (NORVASC) 10 MG tablet  08/24/19   [provider]  benazepril (LOTENSIN) 5 MG tablet Take 5 mg by mouth daily. 08/24/19   [provider]  lansoprazole (PREVACID) 30 MG capsule Take 30 mg by mouth 2 (two) times daily. 09/26/19   [provider]  naproxen (NAPROSYN) 500 MG tablet Take 1 tablet (500 mg total) by mouth 2 (two) times daily. 07/29/20   Durward Parcel, FNP      Allergies    Claritin-d 24 hour [loratadine-pseudoephedrine er]    Review of Systems   Review of Systems  All other systems reviewed and are negative.   Physical Exam Updated Vital Signs BP (!) 140/55   Pulse 68   Temp 97.7 F (36.5 C) (Oral)   Resp 19   Ht 5\' 3"  (1.6 m)   Wt 61.2 kg   SpO2 96%   BMI 23.91 kg/m  Physical Exam Vitals and nursing note reviewed.  Constitutional:      General: She is not in acute distress.    Appearance: She is well-developed.  She is not diaphoretic.  HENT:     Head: Normocephalic and atraumatic.  Eyes:     Extraocular Movements: Extraocular movements intact.     Pupils: Pupils are equal, round, and reactive to light.  Cardiovascular:     Rate and Rhythm: Normal rate and regular rhythm.     Heart sounds: No murmur heard.    No friction rub. No gallop.  Pulmonary:     Effort: Pulmonary effort is normal. No respiratory distress.     Breath sounds: Normal breath sounds. No wheezing.  Abdominal:     General: Bowel sounds are normal. There is no distension.     Palpations: Abdomen is soft.     Tenderness: There is no abdominal tenderness.  Musculoskeletal:        General: Normal range of motion.     Cervical back: Normal range of motion and neck supple.  Skin:    General: Skin is warm and dry.  Neurological:     General: No focal deficit present.     Mental Status: She is alert and oriented to person, place, and time.     Cranial Nerves: No cranial nerve deficit.     Motor: No weakness.     ED Results / Procedures / Treatments   Labs (all  labs ordered are listed, but only abnormal results are displayed) Labs Reviewed  BASIC METABOLIC PANEL - Abnormal; Notable for the following components:      Result Value   Potassium 3.2 (*)    Glucose, Bld 164 (*)    All other components within normal limits  CBC    EKG EKG Interpretation  Date/Time:  Tuesday May 25 2022 23:55:50 EST Ventricular Rate:  80 PR Interval:  152 QRS Duration: 142 QT Interval:  458 QTC Calculation: 528 R Axis:   87 Text Interpretation: Sinus rhythm with Premature atrial complexes with Abberant conduction Right bundle branch block Septal infarct , age undetermined Abnormal ECG When compared with ECG of 03-Oct-2019 08:34,no significant change is noted Confirmed by Geoffery Lyons [phone]) on 05/26/2022 12:40:28 AM  Radiology No results found.  Procedures Procedures    Medications Ordered in ED Medications  meclizine  (ANTIVERT) tablet 25 mg (has no administration in time range)    ED Course/ Medical Decision Making/ A&P  Patient is an 86 year old female presenting with elevated blood pressure and dizziness as described in the HPI.  She arrives here with stable vital signs and is neurologically intact.  Initial blood pressure 171/58, but upon my evaluation systolic blood pressure was 140.  Workup initiated including CBC and metabolic panel, both of which were unremarkable.  EKG shows sinus rhythm with no acute changes.  She was given meclizine and observed.  She seems to be doing well.  Blood pressure has been 170 or better and I do not feel as though warrants intervention.  I will have patient watch her blood pressures at home.  I will also prescribe meclizine which she can take for what I believed to be a peripheral vertigo.  Final Clinical Impression(s) / ED Diagnoses Final diagnoses:  None    Rx / DC Orders ED Discharge Orders     None         Geoffery Lyons, MD 05/26/22 [phone]

## 2022-06-04 DIAGNOSIS — R7309 Other abnormal glucose: Secondary | ICD-10-CM | POA: Diagnosis not present

## 2022-06-04 DIAGNOSIS — Z0001 Encounter for general adult medical examination with abnormal findings: Secondary | ICD-10-CM | POA: Diagnosis not present

## 2022-06-04 DIAGNOSIS — Z1331 Encounter for screening for depression: Secondary | ICD-10-CM | POA: Diagnosis not present

## 2022-06-04 DIAGNOSIS — H8110 Benign paroxysmal vertigo, unspecified ear: Secondary | ICD-10-CM | POA: Diagnosis not present

## 2022-06-04 DIAGNOSIS — I10 Essential (primary) hypertension: Secondary | ICD-10-CM | POA: Diagnosis not present

## 2022-06-04 DIAGNOSIS — Z6823 Body mass index (BMI) 23.0-23.9, adult: Secondary | ICD-10-CM | POA: Diagnosis not present

## 2022-09-09 ENCOUNTER — Ambulatory Visit
Admission: RE | Admit: 2022-09-09 | Discharge: 2022-09-09 | Disposition: A | Discharge status: Home / Self Care | Payer: Medicare HMO | Source: Ambulatory Visit

## 2022-09-09 VITALS — BP 175/70 | HR 84 | Temp 97.7°F | Resp 18

## 2022-09-09 DIAGNOSIS — J309 Allergic rhinitis, unspecified: Secondary | ICD-10-CM | POA: Diagnosis not present

## 2022-09-09 HISTORY — DX: Gastro-esophageal reflux disease without esophagitis: K21.9

## 2022-09-09 MED ORDER — AZITHROMYCIN 250 MG PO TABS: 250.0000 mg | ORAL_TABLET | Freq: Every day | ORAL | 0 refills | Status: AC

## 2022-09-09 MED ORDER — BENZONATATE 100 MG PO CAPS: 100.0000 mg | ORAL_CAPSULE | Freq: Three times a day (TID) | ORAL | 0 refills | Status: AC | PRN

## 2022-09-09 NOTE — ED Provider Notes (Signed)
[facility]    CSN: [id number] Arrival date & time: 09/09/22  0913      History   Chief Complaint Chief Complaint  Patient presents with   sneezing, nasal congestion    HPI Felicia Whitehead is a 87 y.o. female.   The history is provided by the patient.   The patient presents for complaints of sneezing and nasal congestion, and hoarseness.  Symptoms started over the past 3 to 4 days.  Patient states yesterday, she lost her voice, but has since returned today.  She denies fever, chills, headache, ear pain, wheezing, shortness of breath, difficulty breathing, chest pain, abdominal pain, nausea, vomiting, or diarrhea.  Patient states "the only thing that helps me is a Z-Pak".  She states that she has been taking Allegra with no relief.  Patient states that she has not used any medications such as nasal sprays, or cough medicine because nothing helps her symptoms.  She denies any sick contacts. Patient declines viral testing.   Past Medical History:  Diagnosis Date   Acid reflux    Hypertension     Patient Active Problem List   Diagnosis Date Noted   Pre-syncope 10/04/2019   Positive H. pylori test    Acute cholecystitis 10/03/2019   Cholelithiasis    Near syncope    Weakness    Abdominal pain, epigastric     Past Surgical History:  Procedure Laterality Date   ABDOMINAL HYSTERECTOMY      OB History   No obstetric history on file.      Home Medications    Prior to Admission medications   Medication Sig Start Date End Date Taking? Authorizing Provider  azithromycin (ZITHROMAX) 250 MG tablet Take 1 tablet (250 mg total) by mouth daily. Take first 2 tablets together, then 1 every day until finished. 09/12/22  Yes Cyleigh Massaro-Warren, Alda Lea, NP  benzonatate (TESSALON) 100 MG capsule Take 1 capsule (100 mg total) by mouth 3 (three) times daily as needed for cough. 09/09/22  Yes Argil Mahl-Warren, Alda Lea, NP  famotidine (PEPCID) 40 MG tablet Take 40 mg by mouth  daily.   Yes [provider]  amLODipine (NORVASC) 10 MG tablet  08/24/19   [provider]  benazepril (LOTENSIN) 5 MG tablet Take 5 mg by mouth daily. 08/24/19   [provider]  lansoprazole (PREVACID) 30 MG capsule Take 30 mg by mouth 2 (two) times daily. 09/26/19   [provider]  meclizine (ANTIVERT) 25 MG tablet Take 1 tablet (25 mg total) by mouth 3 (three) times daily as needed for dizziness. 05/26/22   Veryl Speak, MD  naproxen (NAPROSYN) 500 MG tablet Take 1 tablet (500 mg total) by mouth 2 (two) times daily. 07/29/20   Emerson Monte, FNP    Family History History reviewed. No pertinent family history.  Social History Social History   Tobacco Use   Smoking status: Never   Smokeless tobacco: Never  Substance Use Topics   Alcohol use: Never   Drug use: Never     Allergies   Claritin-d 24 hour [loratadine-pseudoephedrine er]   Review of Systems Review of Systems Per HPI  Physical Exam Triage Vital Signs ED Triage Vitals  Enc Vitals Group     BP 09/09/22 0941 (!) 175/70     Pulse Rate 09/09/22 0941 84     Resp 09/09/22 0941 18     Temp 09/09/22 0941 97.7 F (36.5 C)     Temp Source 09/09/22 0941 Oral  SpO2 09/09/22 0941 95 %     Weight --      Height --      Head Circumference --      Peak Flow --      Pain Score 09/09/22 0942 0     Pain Loc --      Pain Edu? --      Excl. in [place]? --    No data found.  Updated Vital Signs BP (!) 175/70 (BP Location: Right Arm)   Pulse 84   Temp 97.7 F (36.5 C) (Oral)   Resp 18   SpO2 95%   Visual Acuity Right Eye Distance:   Left Eye Distance:   Bilateral Distance:    Right Eye Near:   Left Eye Near:    Bilateral Near:     Physical Exam Vitals and nursing note reviewed.  Constitutional:      General: She is not in acute distress.    Appearance: She is well-developed.  HENT:     Head: Normocephalic.     Right Ear: Tympanic membrane and ear canal normal.      Left Ear: Ear canal normal. A middle ear effusion is present.     Nose: Congestion present. No rhinorrhea.     Right Turbinates: Enlarged and swollen.     Left Turbinates: Enlarged and swollen.     Right Sinus: No maxillary sinus tenderness or frontal sinus tenderness.     Left Sinus: No maxillary sinus tenderness or frontal sinus tenderness.     Mouth/Throat:     Lips: Pink.     Mouth: Mucous membranes are moist.     Pharynx: Uvula midline. Posterior oropharyngeal erythema present. No pharyngeal swelling, oropharyngeal exudate or uvula swelling.     Comments: Cobblestoning present to posterior oropharynx Eyes:     Conjunctiva/sclera: Conjunctivae normal.  Cardiovascular:     Rate and Rhythm: Normal rate and regular rhythm.     Heart sounds: Normal heart sounds.  Pulmonary:     Effort: Pulmonary effort is normal.     Breath sounds: Normal breath sounds. No wheezing.  Abdominal:     General: Bowel sounds are normal.     Palpations: Abdomen is soft.     Tenderness: There is no abdominal tenderness.  Musculoskeletal:     Cervical back: Normal range of motion.  Lymphadenopathy:     Cervical: No cervical adenopathy.  Skin:    General: Skin is warm and dry.  Neurological:     General: No focal deficit present.     Mental Status: She is alert and oriented to person, place, and time.  Psychiatric:        Mood and Affect: Mood normal.        Behavior: Behavior normal.      UC Treatments / Results  Labs (all labs ordered are listed, but only abnormal results are displayed) Labs Reviewed - No data to display  EKG   Radiology No results found.  Procedures Procedures (including critical care time)  Medications Ordered in UC Medications - No data to display  Initial Impression / Assessment and Plan / UC Course  I have reviewed the triage vital signs and the nursing notes.  Pertinent labs & imaging results that were available during my care of the patient were reviewed by  me and considered in my medical decision making (see chart for details).  The patient is well-appearing, she is in no acute distress, she is hypertensive, room air sats  at 95%.  Patient is speaking in complete sentences.  Patient declines viral testing or other testing such as strep.  Patient is stating that a Z-Pak is the only thing that helps her symptoms at this time.  No obvious indication of bacterial etiology based on the duration of the patient's symptoms, and current exam.  Symptoms appear to be consistent with allergic rhinitis.  Patient was offered fluticasone nasal spray, patient states that she has nasal spray at home.  Patient was advised to use medication that she has at home along with continuing her current antihistamine, Allegra.  Patient was prescribed Tessalon 100 mg to help with cough.  Patient was advised that an antibiotic was not indicated based on her current presentation.  Patient was advised that azithromycin 250 mg was prescribed on a watch and wait strategy, but will be available for pickup on 09/12/2022 if her symptoms do not improve.  Patient was in agreement with this plan of care.  All of the patient's questions were answered.  Patient is stable for discharge.  Patient advised to follow-up with her primary care physician if symptoms fail to improve.  Final Clinical Impressions(s) / UC Diagnoses   Final diagnoses:  Allergic rhinitis, unspecified seasonality, unspecified trigger     Discharge Instructions      Take medication as prescribed. Continue your current allergy medication, Allegra.  Also use the nasal spray that you currently have at home. May take over-the-counter Tylenol as needed for pain, fever, or general discomfort. Recommend using normal saline nasal spray throughout the day to help with your nasal congestion. For your cough, it will be helpful for her to use the humidifier in your bedroom at nighttime during sleep and sleeping elevated on pillows while  symptoms persist. As discussed, if your symptoms do not improve by 09/12/2022, a prescription for azithromycin has been sent to your preferred pharmacy to pick up. If symptoms fail to improve, please follow-up with your primary care physician for further evaluation. Follow-up as needed.     ED Prescriptions     Medication Sig Dispense Auth. Provider   benzonatate (TESSALON) 100 MG capsule Take 1 capsule (100 mg total) by mouth 3 (three) times daily as needed for cough. 30 capsule Ladavion Savitz-Warren, Alda Lea, NP   azithromycin (ZITHROMAX) 250 MG tablet Take 1 tablet (250 mg total) by mouth daily. Take first 2 tablets together, then 1 every day until finished. 6 tablet Kameah Rawl-Warren, Alda Lea, NP      PDMP not reviewed this encounter.   Tish Men, NP 09/09/22 1110

## 2022-09-09 NOTE — Discharge Instructions (Signed)
Take medication as prescribed. Continue your current allergy medication, Allegra.  Also use the nasal spray that you currently have at home. May take over-the-counter Tylenol as needed for pain, fever, or general discomfort. Recommend using normal saline nasal spray throughout the day to help with your nasal congestion. For your cough, it will be helpful for her to use the humidifier in your bedroom at nighttime during sleep and sleeping elevated on pillows while symptoms persist. As discussed, if your symptoms do not improve by 09/12/2022, a prescription for azithromycin has been sent to your preferred pharmacy to pick up. If symptoms fail to improve, please follow-up with your primary care physician for further evaluation. Follow-up as needed.

## 2022-09-09 NOTE — ED Triage Notes (Signed)
Sneezing, nasal congestion, lost voice yesterday since Sunday.  Has been taking Allegra with no relief.

## 2022-09-24 DIAGNOSIS — I10 Essential (primary) hypertension: Secondary | ICD-10-CM | POA: Diagnosis not present

## 2022-09-24 DIAGNOSIS — E782 Mixed hyperlipidemia: Secondary | ICD-10-CM | POA: Diagnosis not present

## 2022-09-24 DIAGNOSIS — R7309 Other abnormal glucose: Secondary | ICD-10-CM | POA: Diagnosis not present

## 2022-09-24 DIAGNOSIS — Z6824 Body mass index (BMI) 24.0-24.9, adult: Secondary | ICD-10-CM | POA: Diagnosis not present

## 2022-11-03 DIAGNOSIS — H524 Presbyopia: Secondary | ICD-10-CM | POA: Diagnosis not present

## 2022-11-03 DIAGNOSIS — H52223 Regular astigmatism, bilateral: Secondary | ICD-10-CM | POA: Diagnosis not present

## 2022-12-11 ENCOUNTER — Encounter (HOSPITAL_COMMUNITY): Payer: Self-pay

## 2022-12-11 ENCOUNTER — Emergency Department (HOSPITAL_COMMUNITY)
Admission: EM | Admit: 2022-12-11 | Discharge: 2022-12-11 | Disposition: A | Discharge status: Home / Self Care | Payer: Medicare HMO | Attending: Emergency Medicine | Admitting: Emergency Medicine

## 2022-12-11 ENCOUNTER — Other Ambulatory Visit: Payer: Self-pay

## 2022-12-11 DIAGNOSIS — Z79899 Other long term (current) drug therapy: Secondary | ICD-10-CM | POA: Diagnosis not present

## 2022-12-11 DIAGNOSIS — R42 Dizziness and giddiness: Secondary | ICD-10-CM | POA: Diagnosis present

## 2022-12-11 DIAGNOSIS — I10 Essential (primary) hypertension: Secondary | ICD-10-CM

## 2022-12-11 LAB — BASIC METABOLIC PANEL WITH GFR
Anion gap: 10 (ref 5–15)
BUN: 14 mg/dL (ref 8–23)
CO2: 24 mmol/L (ref 22–32)
Calcium: 9.5 mg/dL (ref 8.9–10.3)
Chloride: 103 mmol/L (ref 98–111)
Creatinine, Ser: 0.64 mg/dL (ref 0.44–1.00)
GFR, Estimated: >60 mL/min (ref >60)
Glucose, Bld: 110 mg/dL — ABNORMAL HIGH (ref 70–99)
Potassium: 3.4 mmol/L — ABNORMAL LOW (ref 3.5–5.1)
Sodium: 137 mmol/L (ref 135–145)

## 2022-12-11 NOTE — ED Provider Notes (Signed)
EMERGENCY DEPARTMENT AT [facility] Provider Note   CSN: [id number] Arrival date & time: 12/11/22  2001     History  Chief Complaint  Patient presents with   Hypertension    Felicia Whitehead is a 87 y.o. female.   Hypertension   This patient is a appearing 87 year old female on benazepril and amlodipine for blood pressure.  She reports yesterday she had a brief episode of dizziness described as vertigo lasting about a minute and then completely going away.  She took a single tablet of meclizine.  She has not had any symptoms since.  She is concerned because she has measured her blood pressure several times over the last 24 hours since having the symptoms and it has been elevated as high as 180 systolic.  It is currently 156/67.  She has no symptoms at this time she was just concerned because her blood pressure measurements were elevated.  She states that her family doctor told her to take her blood pressure with meals and anytime she felt bad.    Home Medications Prior to Admission medications   Medication Sig Start Date End Date Taking? Authorizing Provider  amLODipine (NORVASC) 10 MG tablet  08/24/19   [provider]  azithromycin (ZITHROMAX) 250 MG tablet Take 1 tablet (250 mg total) by mouth daily. Take first 2 tablets together, then 1 every day until finished. 09/12/22   Leath-Warren, Sadie Haber, NP  benazepril (LOTENSIN) 5 MG tablet Take 5 mg by mouth daily. 08/24/19   [provider]  benzonatate (TESSALON) 100 MG capsule Take 1 capsule (100 mg total) by mouth 3 (three) times daily as needed for cough. 09/09/22   Leath-Warren, Sadie Haber, NP  famotidine (PEPCID) 40 MG tablet Take 40 mg by mouth daily.    [provider]  lansoprazole (PREVACID) 30 MG capsule Take 30 mg by mouth 2 (two) times daily. 09/26/19   [provider]  meclizine (ANTIVERT) 25 MG tablet Take 1 tablet (25 mg total) by mouth 3 (three) times daily as  needed for dizziness. 05/26/22   Geoffery Lyons, MD  naproxen (NAPROSYN) 500 MG tablet Take 1 tablet (500 mg total) by mouth 2 (two) times daily. 07/29/20   Durward Parcel, FNP      Allergies    Claritin-d 24 hour [loratadine-pseudoephedrine er]    Review of Systems   Review of Systems  All other systems reviewed and are negative.   Physical Exam Updated Vital Signs BP (!) 158/67   Pulse 85   Temp 97.9 F (36.6 C) (Oral)   Resp 18   Ht 1.6 m (5\' 3" )   Wt 61.2 kg   SpO2 96%   BMI 23.90 kg/m  Physical Exam Vitals and nursing note reviewed.  Constitutional:      General: She is not in acute distress.    Appearance: She is well-developed.  HENT:     Head: Normocephalic and atraumatic.     Mouth/Throat:     Pharynx: No oropharyngeal exudate.  Eyes:     General: No scleral icterus.       Right eye: No discharge.        Left eye: No discharge.     Conjunctiva/sclera: Conjunctivae normal.     Pupils: Pupils are equal, round, and reactive to light.  Neck:     Thyroid: No thyromegaly.     Vascular: No JVD.  Cardiovascular:     Rate and Rhythm: Normal rate and regular  rhythm.     Heart sounds: Normal heart sounds. No murmur heard.    No friction rub. No gallop.  Pulmonary:     Effort: Pulmonary effort is normal. No respiratory distress.     Breath sounds: Normal breath sounds. No wheezing or rales.  Abdominal:     General: Bowel sounds are normal. There is no distension.     Palpations: Abdomen is soft. There is no mass.     Tenderness: There is no abdominal tenderness.  Musculoskeletal:        General: No tenderness. Normal range of motion.     Cervical back: Normal range of motion and neck supple.     Right lower leg: No edema.     Left lower leg: No edema.  Lymphadenopathy:     Cervical: No cervical adenopathy.  Skin:    General: Skin is warm and dry.     Findings: No erythema or rash.  Neurological:     General: No focal deficit present.     Mental Status:  She is alert.     Coordination: Coordination normal.     Comments: Normal strength in all 4 extremities, normal level of alertness, normal cranial nerves III through XII  Psychiatric:        Behavior: Behavior normal.     ED Results / Procedures / Treatments   Labs (all labs ordered are listed, but only abnormal results are displayed) Labs Reviewed  BASIC METABOLIC PANEL - Abnormal; Notable for the following components:      Result Value   Potassium 3.4 (*)    Glucose, Bld 110 (*)    All other components within normal limits    EKG EKG Interpretation Date/Time:  Saturday December 11 2022 20:12:33 EDT Ventricular Rate:  78 PR Interval:  164 QRS Duration:  156 QT Interval:  401 QTC Calculation: 457 R Axis:   61  Text Interpretation: Sinus rhythm Right bundle branch block since last tracing no significant change Confirmed by Eber Hong ([id number]) on 12/11/2022 8:24:50 PM  Radiology No results found.  Procedures Procedures    Medications Ordered in ED Medications - No data to display  ED Course/ Medical Decision Making/ A&P                             Medical Decision Making Amount and/or Complexity of Data Reviewed Labs: ordered.   Patient is well-appearing Blood pressure is 158/67 Heart rate is 85 and her exam is unremarkable Will check electrolytes, give the patient reassurance, her medications are likely working but is her anxiety that is a self-fulfilling prophecy on the perceived hypertension.  She can follow-up in the outpatient setting  Actually recommended that the patient not check her blood pressure every day but only take her medication and have her family to recheck it in 2 weeks.  The patient is agreeable doc  Metabolic panel totally normal except for minimal hypokalemia nothing that needs intervention.  Blood pressure remained stable  Patient updated on her results and treatment plan, she is agreeable, no change in medications        Final  Clinical Impression(s) / ED Diagnoses Final diagnoses:  Primary hypertension    Rx / DC Orders ED Discharge Orders     None         Eber Hong, MD 12/11/22 2101

## 2022-12-11 NOTE — ED Triage Notes (Signed)
Pt reports she checks her BP every morning and has had elevated elevated BP since yesterday.  Pt reports BP of 163/70, and 182/76.

## 2022-12-11 NOTE — Discharge Instructions (Signed)
I would only recommend that you take your medications exactly as prescribed by your doctor, do not measure your blood pressure at home unless you are taking it once a day 2 hours after you take your medication.  Have your family doctor recheck you within 1 week.  Your testing was normal and reassuring, no signs of heart attack or kidney dysfunction, ER for severe or worsening symptoms

## 2022-12-16 ENCOUNTER — Telehealth: Payer: Self-pay

## 2022-12-16 NOTE — Telephone Encounter (Signed)
Transition Care Management Unsuccessful Follow-up Telephone Call  Date of discharge and from where:  12/11/2022 [facility]  Attempts:  1st Attempt  Reason for unsuccessful TCM follow-up call:  No answer/busy  Rider Ermis [facility]  [facility] Population Health Community Resource Care Guide   [email or web address]  ?? [phone]   Website: [email or web address]  [email or web address]

## 2022-12-17 ENCOUNTER — Telehealth: Payer: Self-pay

## 2022-12-17 NOTE — Telephone Encounter (Signed)
Transition Care Management Unsuccessful Follow-up Telephone Call  Date of discharge and from where:  12/11/2022 [facility]  Attempts:  2nd Attempt  Reason for unsuccessful TCM follow-up call:  Left voice message  Qiana Landgrebe [facility]  [facility] Population Health Community Resource Care Guide   [email or web address]  ?? [phone]   Website: [email or web address]  [email or web address]

## 2023-01-20 DIAGNOSIS — Z6824 Body mass index (BMI) 24.0-24.9, adult: Secondary | ICD-10-CM | POA: Diagnosis not present

## 2023-01-20 DIAGNOSIS — I10 Essential (primary) hypertension: Secondary | ICD-10-CM | POA: Diagnosis not present

## 2023-01-20 DIAGNOSIS — Z6823 Body mass index (BMI) 23.0-23.9, adult: Secondary | ICD-10-CM | POA: Diagnosis not present

## 2023-01-20 DIAGNOSIS — R7309 Other abnormal glucose: Secondary | ICD-10-CM | POA: Diagnosis not present

## 2023-01-20 DIAGNOSIS — E782 Mixed hyperlipidemia: Secondary | ICD-10-CM | POA: Diagnosis not present

## 2023-01-20 DIAGNOSIS — H8110 Benign paroxysmal vertigo, unspecified ear: Secondary | ICD-10-CM | POA: Diagnosis not present

## 2023-06-03 DIAGNOSIS — Z1231 Encounter for screening mammogram for malignant neoplasm of breast: Secondary | ICD-10-CM | POA: Diagnosis not present

## 2023-08-16 DIAGNOSIS — I10 Essential (primary) hypertension: Secondary | ICD-10-CM | POA: Diagnosis not present

## 2023-08-16 DIAGNOSIS — R7309 Other abnormal glucose: Secondary | ICD-10-CM | POA: Diagnosis not present

## 2023-08-16 DIAGNOSIS — Z0001 Encounter for general adult medical examination with abnormal findings: Secondary | ICD-10-CM | POA: Diagnosis not present

## 2023-12-15 DIAGNOSIS — I10 Essential (primary) hypertension: Secondary | ICD-10-CM | POA: Diagnosis not present

## 2023-12-15 DIAGNOSIS — E782 Mixed hyperlipidemia: Secondary | ICD-10-CM | POA: Diagnosis not present

## 2023-12-15 DIAGNOSIS — Z6824 Body mass index (BMI) 24.0-24.9, adult: Secondary | ICD-10-CM | POA: Diagnosis not present

## 2023-12-15 DIAGNOSIS — R7309 Other abnormal glucose: Secondary | ICD-10-CM | POA: Diagnosis not present

## 2024-01-31 DIAGNOSIS — M1990 Unspecified osteoarthritis, unspecified site: Secondary | ICD-10-CM | POA: Diagnosis not present

## 2024-01-31 DIAGNOSIS — E782 Mixed hyperlipidemia: Secondary | ICD-10-CM | POA: Diagnosis not present

## 2024-01-31 DIAGNOSIS — I10 Essential (primary) hypertension: Secondary | ICD-10-CM | POA: Diagnosis not present

## 2024-01-31 DIAGNOSIS — Z7689 Persons encountering health services in other specified circumstances: Secondary | ICD-10-CM | POA: Diagnosis not present

## 2024-01-31 DIAGNOSIS — G479 Sleep disorder, unspecified: Secondary | ICD-10-CM | POA: Diagnosis not present
